# Patient Record
Sex: Female | Born: 1980 | Race: White | Hispanic: No | Marital: Married | Smoking: Current every day smoker
Health system: Southern US, Community
[De-identification: ages and names within clinical notes are randomized; demographics above are authoritative.]

## PROBLEM LIST (undated history)

## (undated) DIAGNOSIS — F1910 Other psychoactive substance abuse, uncomplicated: Secondary | ICD-10-CM

## (undated) DIAGNOSIS — G2581 Restless legs syndrome: Secondary | ICD-10-CM

## (undated) DIAGNOSIS — F32A Depression, unspecified: Secondary | ICD-10-CM

## (undated) DIAGNOSIS — F419 Anxiety disorder, unspecified: Secondary | ICD-10-CM

## (undated) DIAGNOSIS — K219 Gastro-esophageal reflux disease without esophagitis: Secondary | ICD-10-CM

## (undated) DIAGNOSIS — F329 Major depressive disorder, single episode, unspecified: Secondary | ICD-10-CM

## (undated) HISTORY — DX: Other psychoactive substance abuse, uncomplicated: F19.10

## (undated) HISTORY — DX: Major depressive disorder, single episode, unspecified: F32.9

## (undated) HISTORY — DX: Anxiety disorder, unspecified: F41.9

## (undated) HISTORY — DX: Restless legs syndrome: G25.81

## (undated) HISTORY — DX: Depression, unspecified: F32.A

## (undated) HISTORY — DX: Gastro-esophageal reflux disease without esophagitis: K21.9

---
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
## 1999-02-04 ENCOUNTER — Encounter: Admission: RE | Admit: 1999-02-04 | Discharge: 1999-02-04 | Payer: Self-pay | Admitting: Sports Medicine

## 1999-07-02 ENCOUNTER — Encounter: Admission: RE | Admit: 1999-07-02 | Discharge: 1999-07-02 | Payer: Self-pay | Admitting: Pediatrics

## 1999-07-16 ENCOUNTER — Encounter: Admission: RE | Admit: 1999-07-16 | Discharge: 1999-07-16 | Payer: Self-pay | Admitting: Family Medicine

## 1999-07-23 ENCOUNTER — Encounter: Admission: RE | Admit: 1999-07-23 | Discharge: 1999-07-23 | Payer: Self-pay | Admitting: Family Medicine

## 2000-01-28 ENCOUNTER — Encounter: Admission: RE | Admit: 2000-01-28 | Discharge: 2000-01-28 | Payer: Self-pay | Admitting: Family Medicine

## 2000-09-13 ENCOUNTER — Other Ambulatory Visit: Admission: RE | Admit: 2000-09-13 | Discharge: 2000-10-16 | Payer: Self-pay | Admitting: Obstetrics

## 2000-09-21 ENCOUNTER — Encounter: Admission: RE | Admit: 2000-09-21 | Discharge: 2000-09-21 | Payer: Self-pay | Admitting: Family Medicine

## 2001-10-24 ENCOUNTER — Encounter (INDEPENDENT_AMBULATORY_CARE_PROVIDER_SITE_OTHER): Payer: Self-pay | Admitting: *Deleted

## 2001-10-25 ENCOUNTER — Other Ambulatory Visit: Admission: RE | Admit: 2001-10-25 | Discharge: 2001-10-25 | Payer: Self-pay | Admitting: Family Medicine

## 2001-10-25 ENCOUNTER — Encounter: Admission: RE | Admit: 2001-10-25 | Discharge: 2001-10-25 | Payer: Self-pay | Admitting: Sports Medicine

## 2001-10-25 ENCOUNTER — Encounter (INDEPENDENT_AMBULATORY_CARE_PROVIDER_SITE_OTHER): Payer: Self-pay | Admitting: Specialist

## 2003-07-19 ENCOUNTER — Emergency Department (HOSPITAL_COMMUNITY): Admission: EM | Admit: 2003-07-19 | Discharge: 2003-07-19 | Payer: Self-pay | Admitting: Family Medicine

## 2003-08-13 ENCOUNTER — Encounter: Admission: RE | Admit: 2003-08-13 | Discharge: 2003-08-13 | Payer: Self-pay | Admitting: Family Medicine

## 2003-09-10 ENCOUNTER — Encounter: Admission: RE | Admit: 2003-09-10 | Discharge: 2003-09-10 | Payer: Self-pay | Admitting: Family Medicine

## 2004-06-12 ENCOUNTER — Ambulatory Visit: Payer: Self-pay | Admitting: Sports Medicine

## 2004-07-25 ENCOUNTER — Ambulatory Visit: Payer: Self-pay | Admitting: Family Medicine

## 2005-10-30 ENCOUNTER — Emergency Department (HOSPITAL_COMMUNITY): Admission: EM | Admit: 2005-10-30 | Discharge: 2005-10-30 | Payer: Self-pay | Admitting: Emergency Medicine

## 2005-11-13 ENCOUNTER — Ambulatory Visit: Payer: Self-pay | Admitting: Family Medicine

## 2005-11-15 ENCOUNTER — Emergency Department (HOSPITAL_COMMUNITY): Admission: EM | Admit: 2005-11-15 | Discharge: 2005-11-15 | Payer: Self-pay | Admitting: Emergency Medicine

## 2005-11-16 ENCOUNTER — Ambulatory Visit: Payer: Self-pay | Admitting: Family Medicine

## 2005-11-17 ENCOUNTER — Emergency Department (HOSPITAL_COMMUNITY): Admission: EM | Admit: 2005-11-17 | Discharge: 2005-11-17 | Payer: Self-pay | Admitting: *Deleted

## 2005-11-17 ENCOUNTER — Emergency Department: Payer: Self-pay | Admitting: Unknown Physician Specialty

## 2005-11-18 ENCOUNTER — Emergency Department: Payer: Self-pay | Admitting: Emergency Medicine

## 2005-11-20 ENCOUNTER — Encounter (INDEPENDENT_AMBULATORY_CARE_PROVIDER_SITE_OTHER): Payer: Self-pay | Admitting: Specialist

## 2005-11-20 ENCOUNTER — Ambulatory Visit (HOSPITAL_COMMUNITY): Admission: RE | Admit: 2005-11-20 | Discharge: 2005-11-20 | Payer: Self-pay | Admitting: Gastroenterology

## 2006-02-02 ENCOUNTER — Ambulatory Visit: Payer: Self-pay | Admitting: Family Medicine

## 2006-04-22 DIAGNOSIS — F17200 Nicotine dependence, unspecified, uncomplicated: Secondary | ICD-10-CM

## 2006-04-23 ENCOUNTER — Encounter (INDEPENDENT_AMBULATORY_CARE_PROVIDER_SITE_OTHER): Payer: Self-pay | Admitting: *Deleted

## 2006-05-21 ENCOUNTER — Encounter: Admission: RE | Admit: 2006-05-21 | Discharge: 2006-05-21 | Payer: Self-pay | Admitting: Emergency Medicine

## 2006-06-03 ENCOUNTER — Telehealth: Payer: Self-pay | Admitting: *Deleted

## 2006-07-08 ENCOUNTER — Encounter (INDEPENDENT_AMBULATORY_CARE_PROVIDER_SITE_OTHER): Payer: Self-pay | Admitting: Family Medicine

## 2006-07-08 LAB — CONVERTED CEMR LAB: RH AB Titer: NEGATIVE

## 2007-01-14 ENCOUNTER — Telehealth: Payer: Self-pay | Admitting: *Deleted

## 2007-01-17 ENCOUNTER — Ambulatory Visit: Payer: Self-pay | Admitting: Family Medicine

## 2007-01-19 ENCOUNTER — Ambulatory Visit (HOSPITAL_COMMUNITY): Admission: RE | Admit: 2007-01-19 | Discharge: 2007-01-19 | Payer: Self-pay | Admitting: Family Medicine

## 2007-01-19 ENCOUNTER — Ambulatory Visit: Payer: Self-pay | Admitting: Family Medicine

## 2007-01-28 ENCOUNTER — Ambulatory Visit: Payer: Self-pay | Admitting: Family Medicine

## 2007-02-11 ENCOUNTER — Ambulatory Visit: Payer: Self-pay | Admitting: Family Medicine

## 2007-02-11 ENCOUNTER — Encounter: Payer: Self-pay | Admitting: Family Medicine

## 2007-02-11 ENCOUNTER — Encounter (INDEPENDENT_AMBULATORY_CARE_PROVIDER_SITE_OTHER): Payer: Self-pay | Admitting: Family Medicine

## 2007-02-11 ENCOUNTER — Other Ambulatory Visit: Admission: RE | Admit: 2007-02-11 | Discharge: 2007-02-11 | Payer: Self-pay | Admitting: Family Medicine

## 2007-02-11 DIAGNOSIS — K219 Gastro-esophageal reflux disease without esophagitis: Secondary | ICD-10-CM | POA: Insufficient documentation

## 2007-02-11 DIAGNOSIS — F410 Panic disorder [episodic paroxysmal anxiety] without agoraphobia: Secondary | ICD-10-CM

## 2007-02-11 LAB — CONVERTED CEMR LAB: GC Culture Only: NEGATIVE

## 2007-02-12 LAB — CONVERTED CEMR LAB
Basophils Absolute: 0 10*3/uL (ref 0.0–0.1)
Chlamydia, DNA Probe: NEGATIVE
Hemoglobin: 13.9 g/dL (ref 12.0–15.0)
Lymphocytes Relative: 31 % (ref 12–46)
Lymphs Abs: 2.2 10*3/uL (ref 0.7–4.0)
Monocytes Absolute: 0.6 10*3/uL (ref 0.1–1.0)
Monocytes Relative: 8 % (ref 3–12)
Neutro Abs: 4.1 10*3/uL (ref 1.7–7.7)
RBC: 4.85 M/uL (ref 3.87–5.11)
RDW: 13.5 % (ref 11.5–15.5)
Rh Type: NEGATIVE
Rubella: 17.6 intl units/mL — ABNORMAL HIGH
WBC: 7 10*3/uL (ref 4.0–10.5)

## 2007-02-14 ENCOUNTER — Telehealth: Payer: Self-pay | Admitting: *Deleted

## 2007-03-04 ENCOUNTER — Telehealth (INDEPENDENT_AMBULATORY_CARE_PROVIDER_SITE_OTHER): Payer: Self-pay | Admitting: *Deleted

## 2007-03-16 ENCOUNTER — Ambulatory Visit: Payer: Self-pay | Admitting: Family Medicine

## 2007-03-17 ENCOUNTER — Telehealth: Payer: Self-pay | Admitting: *Deleted

## 2007-04-14 ENCOUNTER — Ambulatory Visit: Payer: Self-pay | Admitting: Family Medicine

## 2007-04-14 LAB — CONVERTED CEMR LAB
Glucose, Urine, Semiquant: NEGATIVE
Protein, U semiquant: NEGATIVE

## 2007-05-05 ENCOUNTER — Ambulatory Visit (HOSPITAL_COMMUNITY): Admission: RE | Admit: 2007-05-05 | Discharge: 2007-05-05 | Payer: Self-pay | Admitting: Family Medicine

## 2007-05-05 ENCOUNTER — Encounter (INDEPENDENT_AMBULATORY_CARE_PROVIDER_SITE_OTHER): Payer: Self-pay | Admitting: Family Medicine

## 2007-05-10 ENCOUNTER — Telehealth (INDEPENDENT_AMBULATORY_CARE_PROVIDER_SITE_OTHER): Payer: Self-pay | Admitting: Family Medicine

## 2007-05-24 ENCOUNTER — Ambulatory Visit: Payer: Self-pay | Admitting: Family Medicine

## 2007-06-27 ENCOUNTER — Ambulatory Visit: Payer: Self-pay | Admitting: Family Medicine

## 2007-06-27 LAB — CONVERTED CEMR LAB: Protein, U semiquant: NEGATIVE

## 2007-07-06 ENCOUNTER — Ambulatory Visit (HOSPITAL_COMMUNITY): Admission: RE | Admit: 2007-07-06 | Discharge: 2007-07-06 | Payer: Self-pay | Admitting: Family Medicine

## 2007-07-07 ENCOUNTER — Encounter (INDEPENDENT_AMBULATORY_CARE_PROVIDER_SITE_OTHER): Payer: Self-pay | Admitting: Family Medicine

## 2007-07-07 ENCOUNTER — Ambulatory Visit: Payer: Self-pay | Admitting: Family Medicine

## 2007-07-11 ENCOUNTER — Ambulatory Visit: Payer: Self-pay | Admitting: Sports Medicine

## 2007-07-11 LAB — CONVERTED CEMR LAB
Glucose, Urine, Semiquant: NEGATIVE
Protein, U semiquant: NEGATIVE

## 2007-07-13 ENCOUNTER — Ambulatory Visit: Payer: Self-pay | Admitting: Family Medicine

## 2007-07-13 ENCOUNTER — Encounter (INDEPENDENT_AMBULATORY_CARE_PROVIDER_SITE_OTHER): Payer: Self-pay | Admitting: Family Medicine

## 2007-07-14 LAB — CONVERTED CEMR LAB

## 2007-07-28 ENCOUNTER — Ambulatory Visit: Payer: Self-pay | Admitting: Family Medicine

## 2007-08-01 ENCOUNTER — Encounter (INDEPENDENT_AMBULATORY_CARE_PROVIDER_SITE_OTHER): Payer: Self-pay | Admitting: Family Medicine

## 2007-08-01 ENCOUNTER — Ambulatory Visit (HOSPITAL_COMMUNITY): Admission: RE | Admit: 2007-08-01 | Discharge: 2007-08-01 | Payer: Self-pay | Admitting: Family Medicine

## 2007-08-09 ENCOUNTER — Ambulatory Visit: Payer: Self-pay | Admitting: Family Medicine

## 2007-08-09 LAB — CONVERTED CEMR LAB: Glucose, Urine, Semiquant: NEGATIVE

## 2007-08-24 ENCOUNTER — Ambulatory Visit (HOSPITAL_COMMUNITY): Admission: RE | Admit: 2007-08-24 | Discharge: 2007-08-24 | Payer: Self-pay | Admitting: Family Medicine

## 2007-08-24 ENCOUNTER — Ambulatory Visit: Payer: Self-pay | Admitting: Family Medicine

## 2007-08-24 ENCOUNTER — Encounter (INDEPENDENT_AMBULATORY_CARE_PROVIDER_SITE_OTHER): Payer: Self-pay | Admitting: Family Medicine

## 2007-08-24 ENCOUNTER — Telehealth: Payer: Self-pay | Admitting: *Deleted

## 2007-08-24 LAB — CONVERTED CEMR LAB: Protein, U semiquant: NEGATIVE

## 2007-08-25 ENCOUNTER — Encounter (INDEPENDENT_AMBULATORY_CARE_PROVIDER_SITE_OTHER): Payer: Self-pay | Admitting: Family Medicine

## 2007-09-01 ENCOUNTER — Ambulatory Visit: Payer: Self-pay | Admitting: Family Medicine

## 2007-09-01 ENCOUNTER — Telehealth: Payer: Self-pay | Admitting: *Deleted

## 2007-09-07 ENCOUNTER — Ambulatory Visit: Payer: Self-pay | Admitting: Family Medicine

## 2007-09-07 LAB — CONVERTED CEMR LAB
Glucose, Urine, Semiquant: NEGATIVE
Protein, U semiquant: NEGATIVE

## 2007-09-14 ENCOUNTER — Ambulatory Visit: Payer: Self-pay | Admitting: Family Medicine

## 2007-09-14 ENCOUNTER — Encounter (INDEPENDENT_AMBULATORY_CARE_PROVIDER_SITE_OTHER): Payer: Self-pay | Admitting: Family Medicine

## 2007-09-14 ENCOUNTER — Ambulatory Visit (HOSPITAL_COMMUNITY): Admission: RE | Admit: 2007-09-14 | Discharge: 2007-09-14 | Payer: Self-pay | Admitting: Family Medicine

## 2007-09-19 ENCOUNTER — Ambulatory Visit: Payer: Self-pay | Admitting: Obstetrics and Gynecology

## 2007-09-19 ENCOUNTER — Inpatient Hospital Stay (HOSPITAL_COMMUNITY): Admission: AD | Admit: 2007-09-19 | Discharge: 2007-09-21 | Payer: Self-pay | Admitting: Family Medicine

## 2007-09-19 ENCOUNTER — Encounter: Payer: Self-pay | Admitting: Obstetrics & Gynecology

## 2007-09-19 ENCOUNTER — Telehealth (INDEPENDENT_AMBULATORY_CARE_PROVIDER_SITE_OTHER): Payer: Self-pay | Admitting: Family Medicine

## 2007-09-23 ENCOUNTER — Telehealth: Payer: Self-pay | Admitting: *Deleted

## 2007-09-26 ENCOUNTER — Telehealth: Payer: Self-pay | Admitting: *Deleted

## 2007-09-26 ENCOUNTER — Ambulatory Visit: Payer: Self-pay | Admitting: Sports Medicine

## 2007-10-04 ENCOUNTER — Telehealth (INDEPENDENT_AMBULATORY_CARE_PROVIDER_SITE_OTHER): Payer: Self-pay | Admitting: Family Medicine

## 2007-10-06 ENCOUNTER — Encounter (INDEPENDENT_AMBULATORY_CARE_PROVIDER_SITE_OTHER): Payer: Self-pay | Admitting: Family Medicine

## 2007-10-06 ENCOUNTER — Ambulatory Visit: Payer: Self-pay | Admitting: Family Medicine

## 2007-10-06 LAB — CONVERTED CEMR LAB
Protein, U semiquant: NEGATIVE
Urobilinogen, UA: 0.2
WBC Urine, dipstick: NEGATIVE

## 2007-10-07 ENCOUNTER — Encounter (INDEPENDENT_AMBULATORY_CARE_PROVIDER_SITE_OTHER): Payer: Self-pay | Admitting: Family Medicine

## 2007-10-07 LAB — CONVERTED CEMR LAB
AST: 15 units/L (ref 0–37)
Albumin: 4.1 g/dL (ref 3.5–5.2)
BUN: 12 mg/dL (ref 6–23)
Barbiturate Quant, Ur: NEGATIVE
Benzodiazepines.: NEGATIVE
Calcium: 9.2 mg/dL (ref 8.4–10.5)
Chloride: 107 meq/L (ref 96–112)
Glucose, Bld: 91 mg/dL (ref 70–99)
Hemoglobin: 14.2 g/dL (ref 12.0–15.0)
Marijuana Metabolite: POSITIVE — AB
Methadone: NEGATIVE
Potassium: 3.9 meq/L (ref 3.5–5.3)
Propoxyphene: NEGATIVE
RBC: 4.58 M/uL (ref 3.87–5.11)
RDW: 13.2 % (ref 11.5–15.5)
TSH: 1.108 microintl units/mL (ref 0.350–4.50)
Total Protein: 6.9 g/dL (ref 6.0–8.3)

## 2007-10-14 ENCOUNTER — Ambulatory Visit: Payer: Self-pay | Admitting: Family Medicine

## 2007-11-07 ENCOUNTER — Encounter: Payer: Self-pay | Admitting: *Deleted

## 2007-11-23 ENCOUNTER — Ambulatory Visit: Payer: Self-pay | Admitting: Family Medicine

## 2007-11-24 ENCOUNTER — Telehealth: Payer: Self-pay | Admitting: *Deleted

## 2007-11-24 ENCOUNTER — Ambulatory Visit: Payer: Self-pay | Admitting: Family Medicine

## 2007-11-25 ENCOUNTER — Telehealth: Payer: Self-pay | Admitting: *Deleted

## 2007-12-26 ENCOUNTER — Telehealth: Payer: Self-pay | Admitting: *Deleted

## 2007-12-28 ENCOUNTER — Telehealth (INDEPENDENT_AMBULATORY_CARE_PROVIDER_SITE_OTHER): Payer: Self-pay | Admitting: *Deleted

## 2008-01-02 ENCOUNTER — Ambulatory Visit: Payer: Self-pay | Admitting: Family Medicine

## 2008-01-24 ENCOUNTER — Telehealth: Payer: Self-pay | Admitting: *Deleted

## 2008-02-27 ENCOUNTER — Telehealth (INDEPENDENT_AMBULATORY_CARE_PROVIDER_SITE_OTHER): Payer: Self-pay | Admitting: Family Medicine

## 2008-03-19 ENCOUNTER — Other Ambulatory Visit: Admission: RE | Admit: 2008-03-19 | Discharge: 2008-03-19 | Payer: Self-pay | Admitting: Family Medicine

## 2008-03-19 ENCOUNTER — Encounter (INDEPENDENT_AMBULATORY_CARE_PROVIDER_SITE_OTHER): Payer: Self-pay | Admitting: Family Medicine

## 2008-03-19 ENCOUNTER — Ambulatory Visit: Payer: Self-pay | Admitting: Family Medicine

## 2008-03-19 DIAGNOSIS — G5600 Carpal tunnel syndrome, unspecified upper limb: Secondary | ICD-10-CM | POA: Insufficient documentation

## 2008-03-19 LAB — CONVERTED CEMR LAB
Chlamydia, DNA Probe: NEGATIVE
Cholesterol: 127 mg/dL (ref 0–200)
GC Probe Amp, Genital: NEGATIVE
HDL: 44 mg/dL (ref >39)
LDL Cholesterol: 74 mg/dL (ref 0–99)
Triglycerides: 45 mg/dL (ref <150)

## 2008-03-27 ENCOUNTER — Telehealth: Payer: Self-pay | Admitting: *Deleted

## 2008-07-24 ENCOUNTER — Encounter (INDEPENDENT_AMBULATORY_CARE_PROVIDER_SITE_OTHER): Payer: Self-pay | Admitting: Family Medicine

## 2008-11-28 ENCOUNTER — Telehealth: Payer: Self-pay | Admitting: Family Medicine

## 2008-12-20 ENCOUNTER — Ambulatory Visit: Payer: Self-pay | Admitting: Family Medicine

## 2008-12-20 DIAGNOSIS — F418 Other specified anxiety disorders: Secondary | ICD-10-CM

## 2008-12-20 DIAGNOSIS — L708 Other acne: Secondary | ICD-10-CM

## 2009-01-24 ENCOUNTER — Telehealth: Payer: Self-pay | Admitting: Family Medicine

## 2009-03-25 ENCOUNTER — Telehealth: Payer: Self-pay | Admitting: Family Medicine

## 2009-04-30 ENCOUNTER — Telehealth: Payer: Self-pay | Admitting: Family Medicine

## 2009-05-03 ENCOUNTER — Ambulatory Visit: Payer: Self-pay | Admitting: Family Medicine

## 2009-05-03 ENCOUNTER — Other Ambulatory Visit: Admission: RE | Admit: 2009-05-03 | Discharge: 2009-05-03 | Payer: Self-pay | Admitting: Family Medicine

## 2009-05-03 DIAGNOSIS — N912 Amenorrhea, unspecified: Secondary | ICD-10-CM | POA: Insufficient documentation

## 2009-05-07 ENCOUNTER — Encounter: Payer: Self-pay | Admitting: Family Medicine

## 2009-05-16 IMAGING — US US OB FOLLOW-UP
1 series · 14 of 26 positions shown · non-contrast
Comparison: none

OBSTETRICAL ULTRASOUND:
 This ultrasound exam was performed in the [HOSPITAL] Ultrasound Department.  The OB US report was generated in the AS system, and faxed to the ordering physician.  This report is also available in [REDACTED] PACS.

[Series 1: us ob follow-up · 0.28mm/px · 14 of 26 slices shown]
[im 1/26]
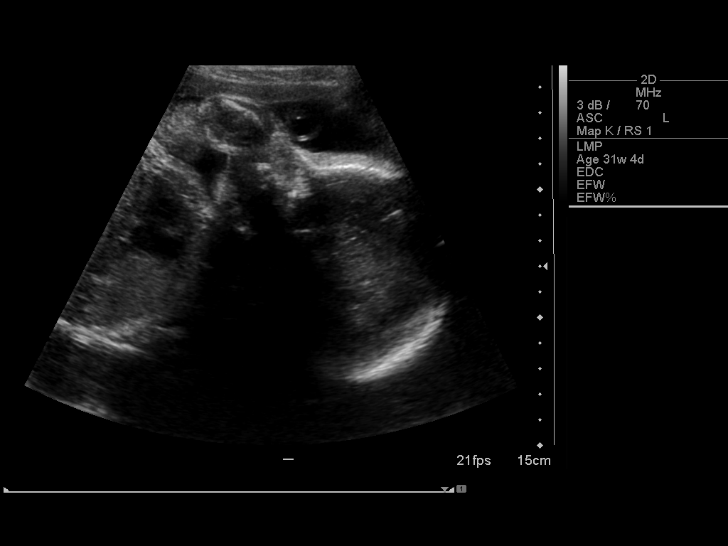
[im 3/26]
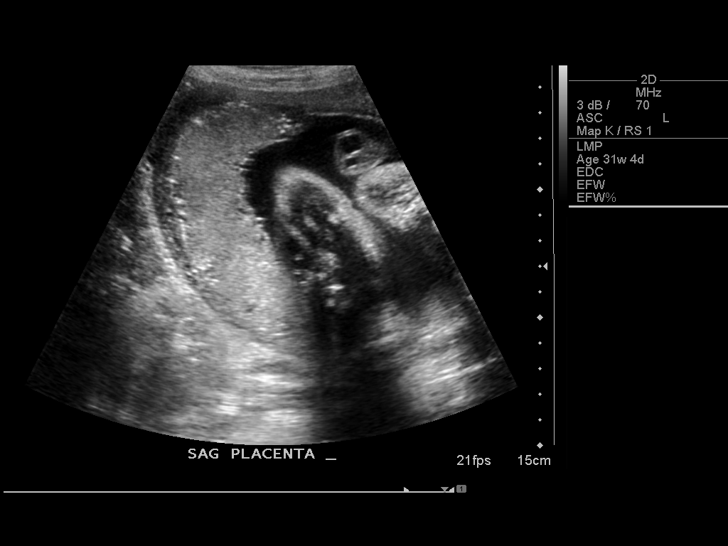
[im 5/26]
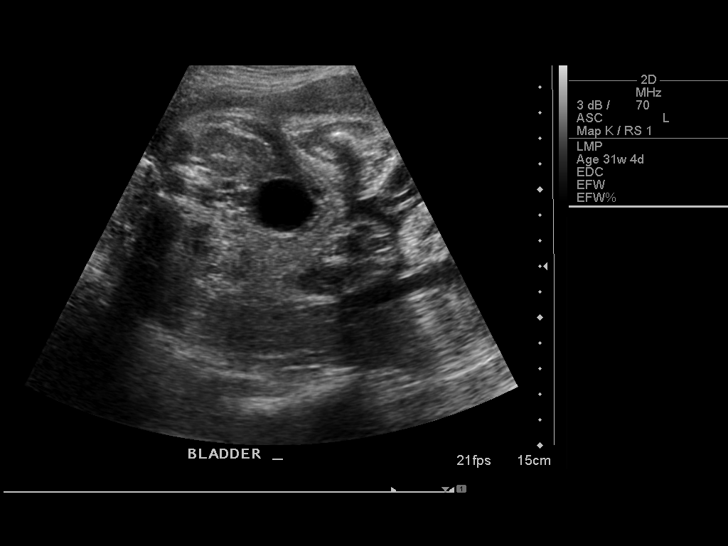
[im 7/26]
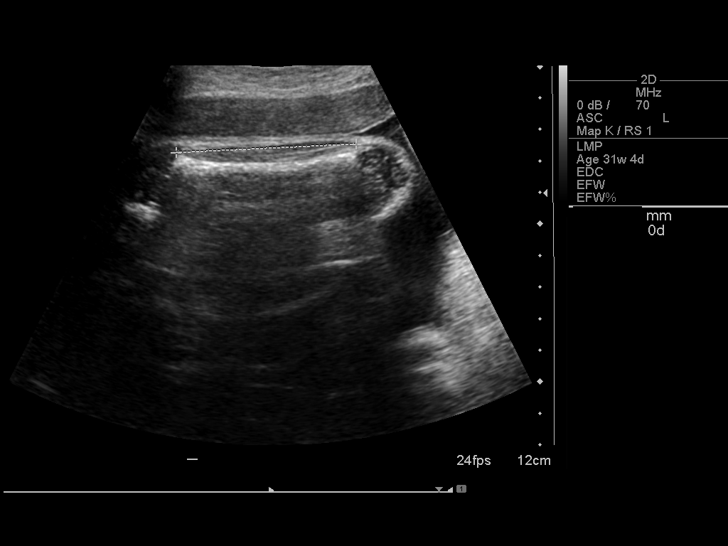
[im 9/26]
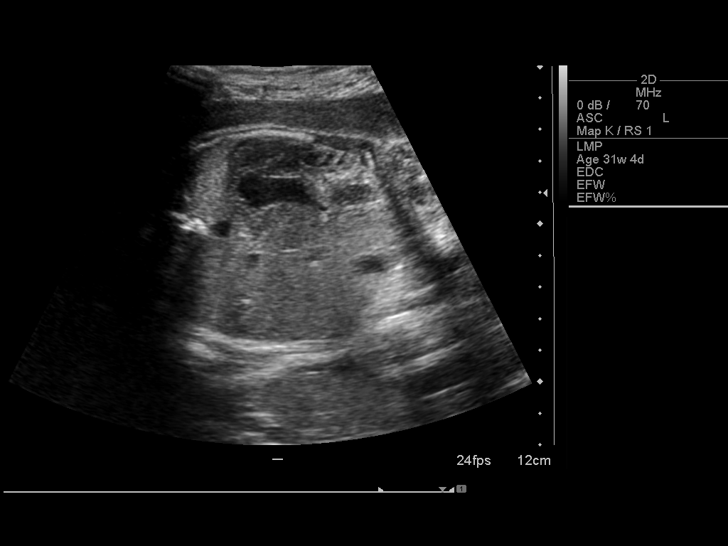
[im 11/26]
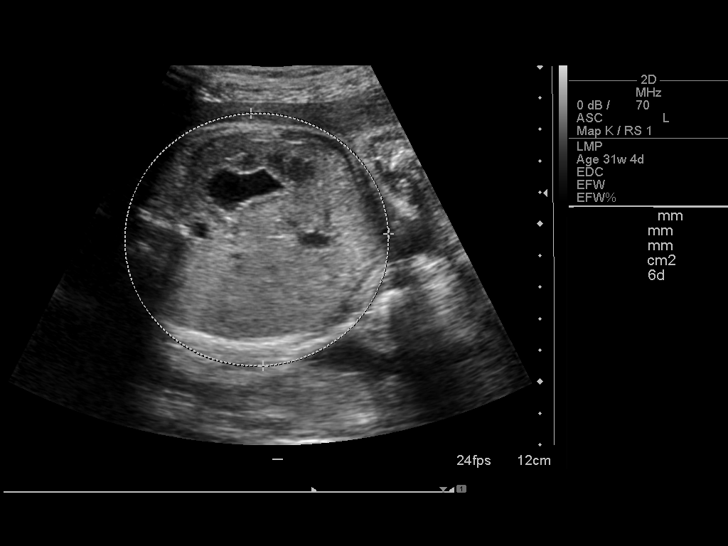
[im 13/26]
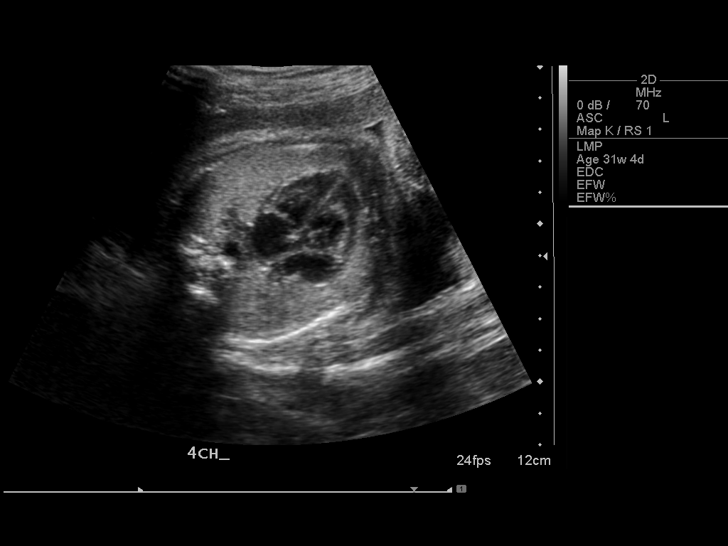
[im 14/26]
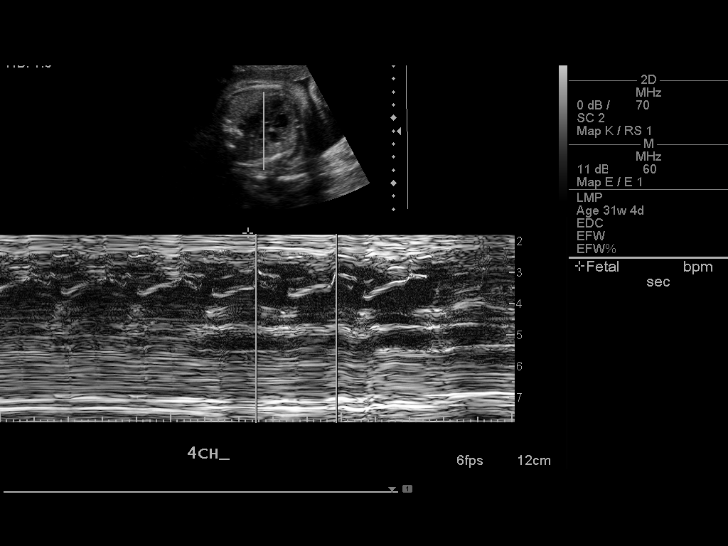
[im 16/26]
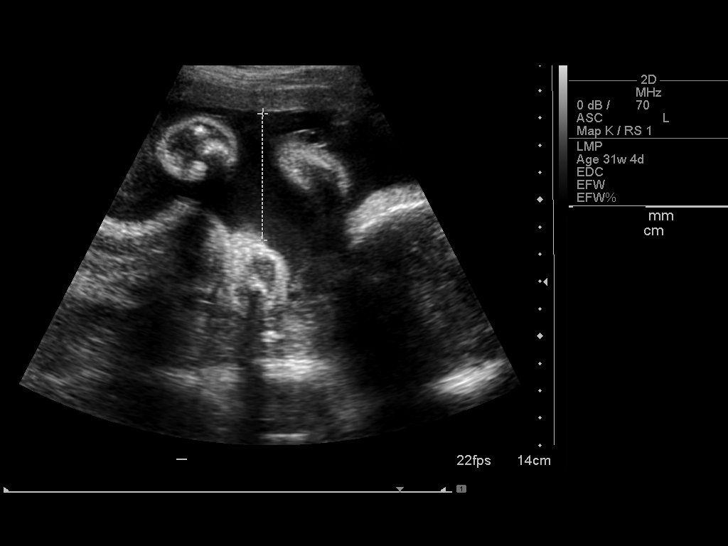
[im 18/26]
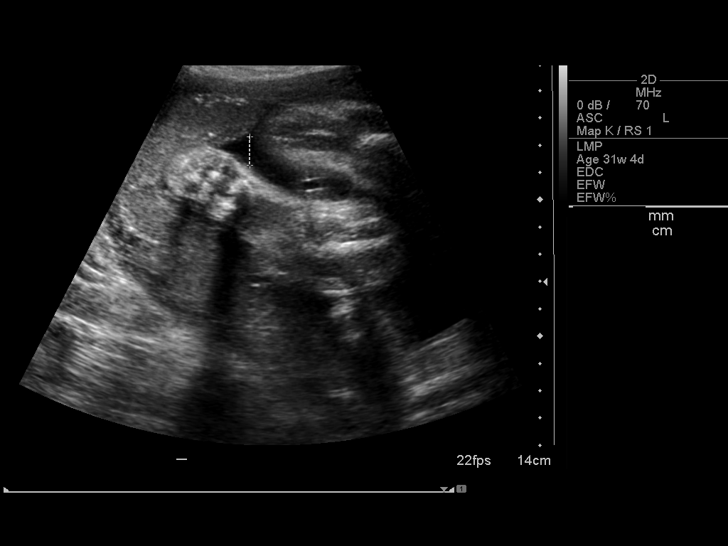
[im 20/26]
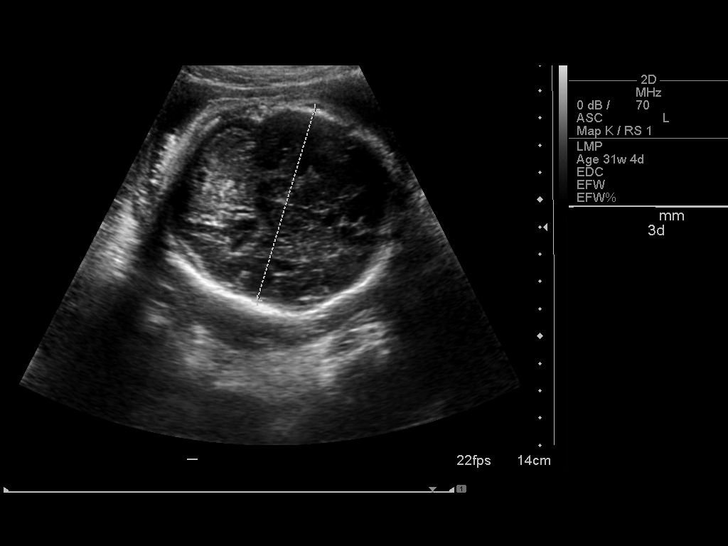
[im 22/26]
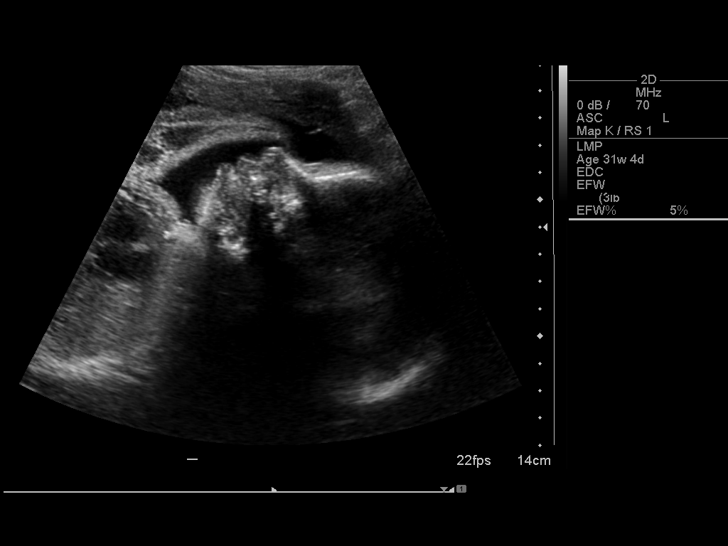
[im 24/26]
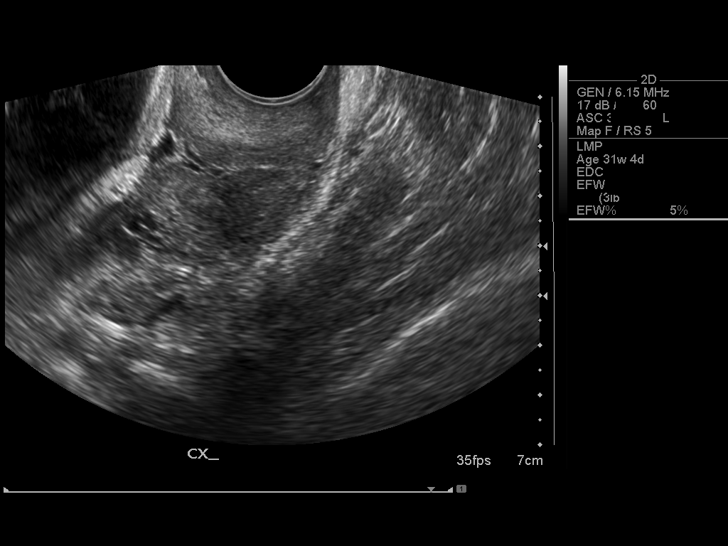
[im 26/26]
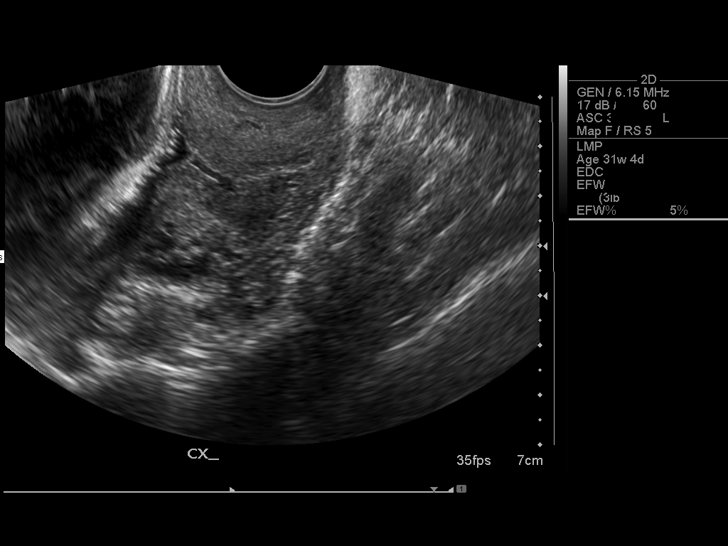

[14 of 26 positions shown; findings below may reference images not displayed]

IMPRESSION: See AS Obstetric US report.

## 2009-05-24 ENCOUNTER — Telehealth: Payer: Self-pay | Admitting: Family Medicine

## 2009-05-30 ENCOUNTER — Telehealth: Payer: Self-pay | Admitting: Family Medicine

## 2009-06-08 IMAGING — US US OB FOLLOW-UP
1 series · 14 of 28 positions shown · non-contrast
Comparison: none

OBSTETRICAL ULTRASOUND:
 This ultrasound exam was performed in the [HOSPITAL] Ultrasound Department.  The OB US report was generated in the AS system, and faxed to the ordering physician.  This report is also available in [REDACTED] PACS.

[Series 1: us ob follow-up · 0.28mm/px · 14 of 29 slices shown]
[im 2/29]
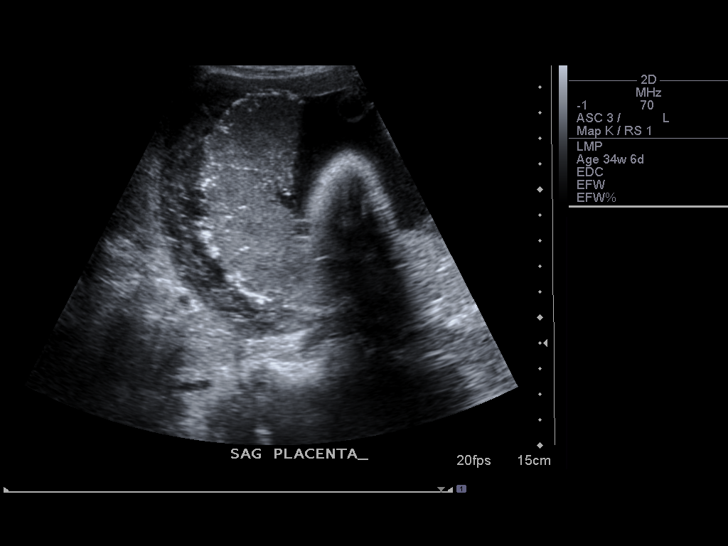
[im 4/29]
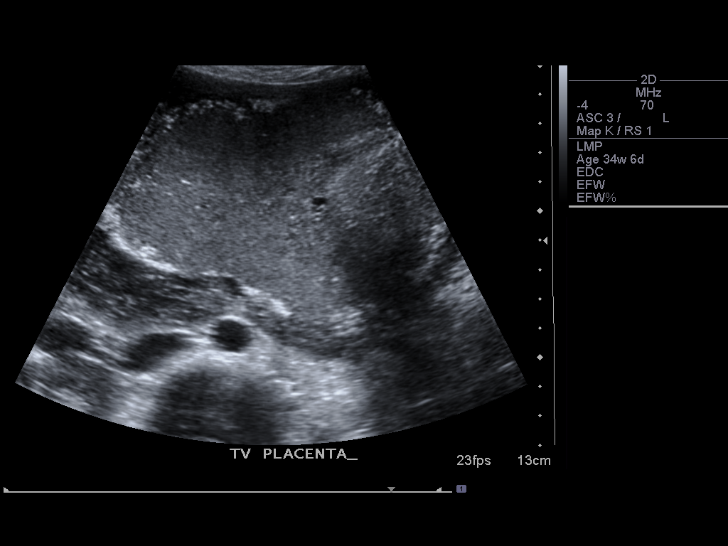
[im 6/29]
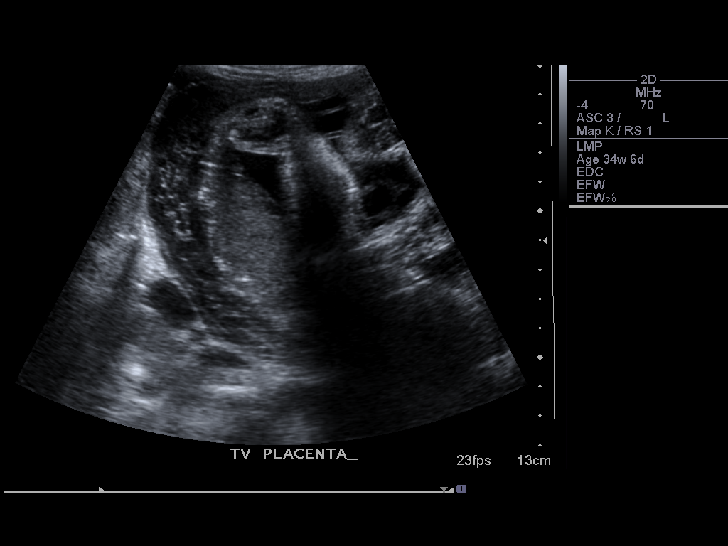
[im 8/29]
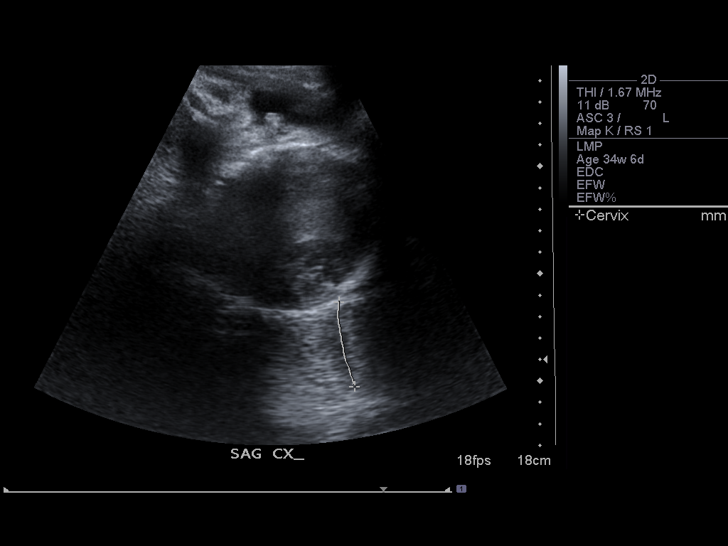
[im 10/29]
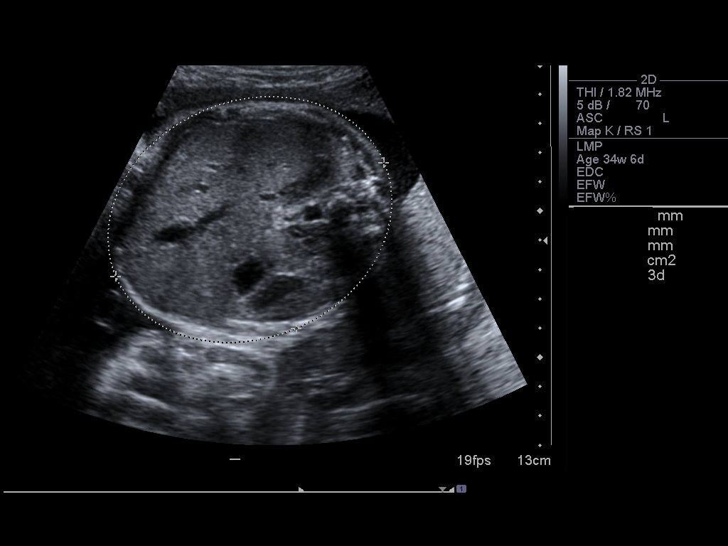
[im 12/29]
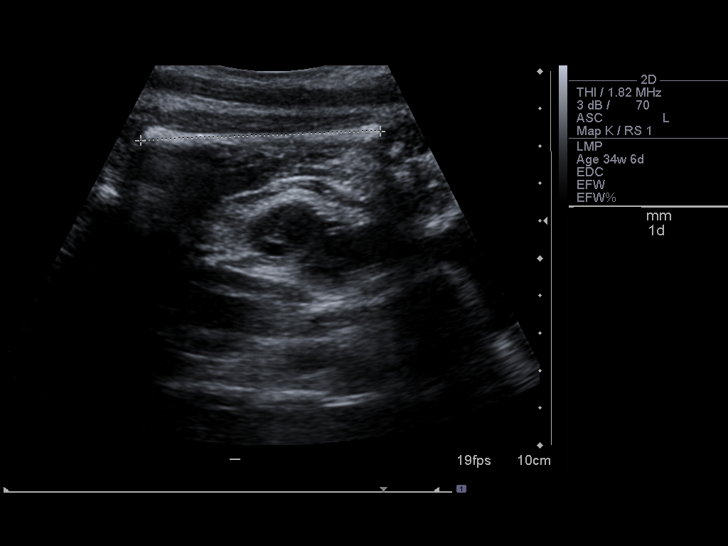
[im 14/29]
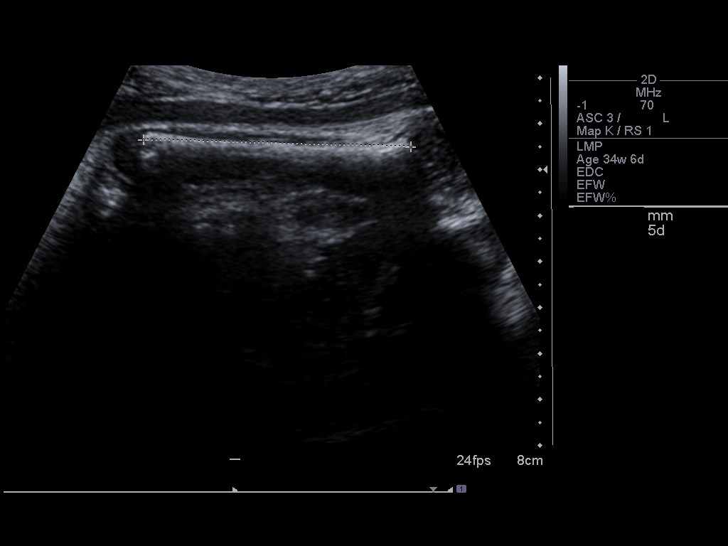
[im 16/29]
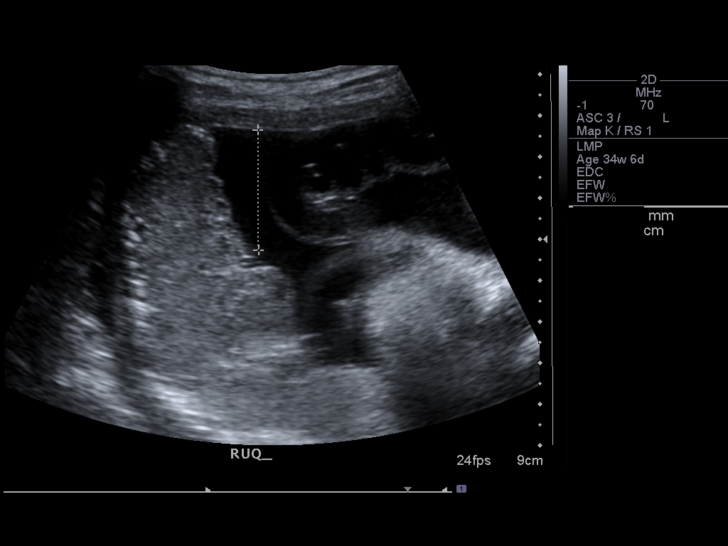
[im 18/29]
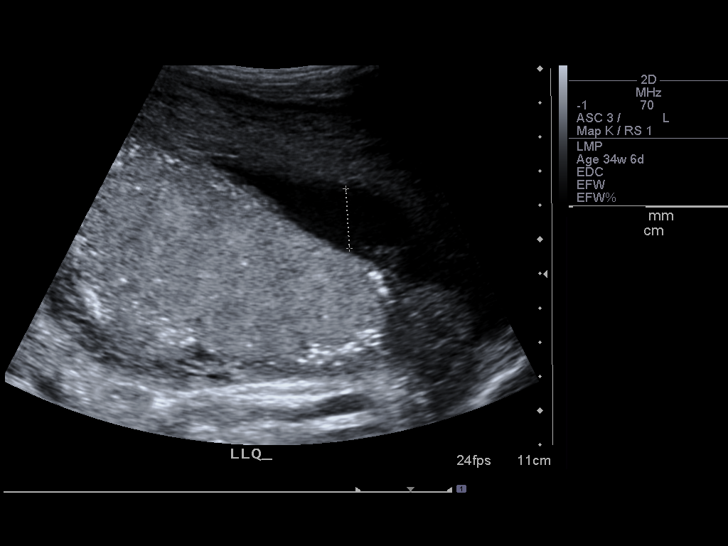
[im 20/29]
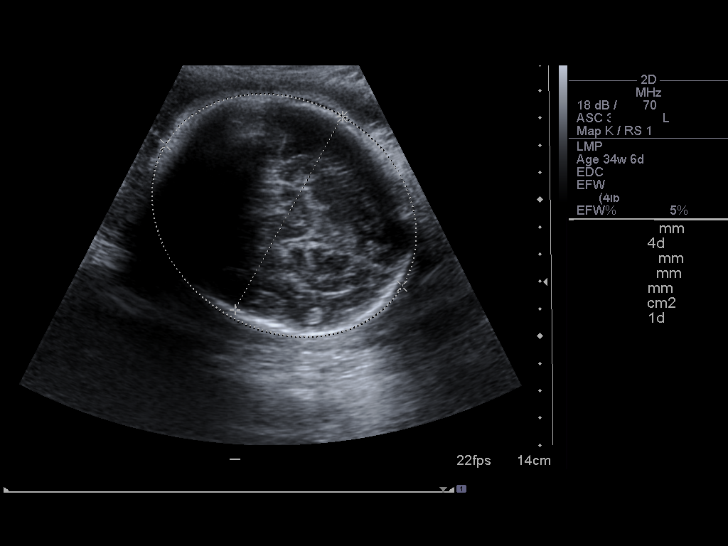
[im 22/29]
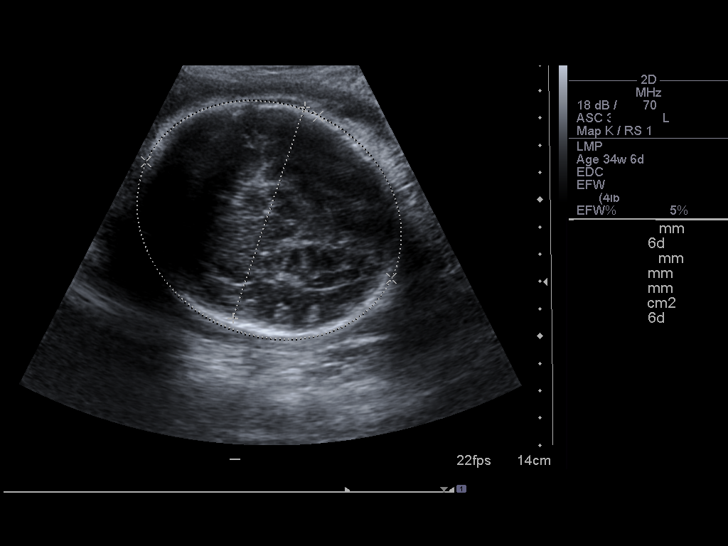
[im 24/29]
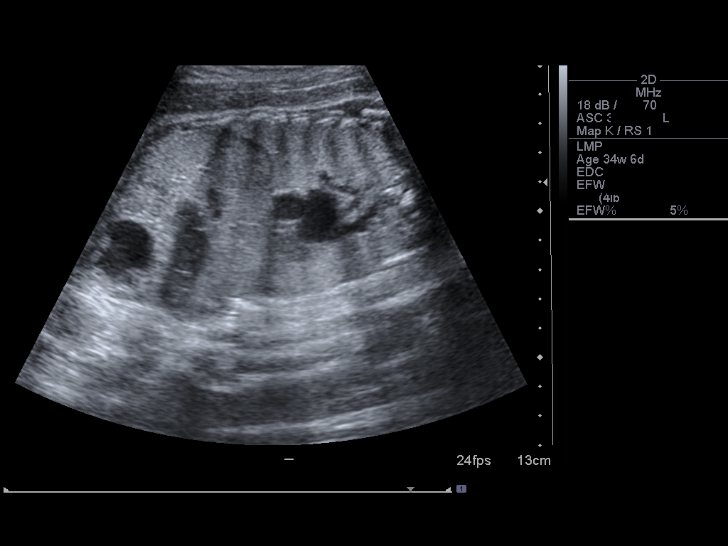
[im 26/29]
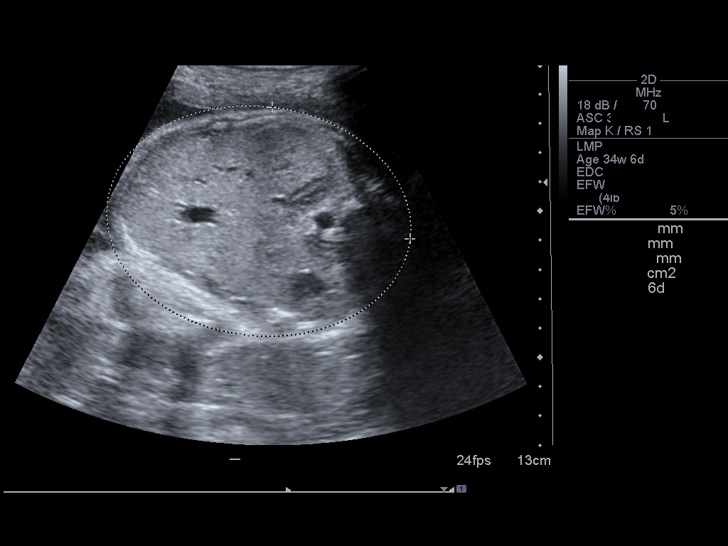
[im 29/29]
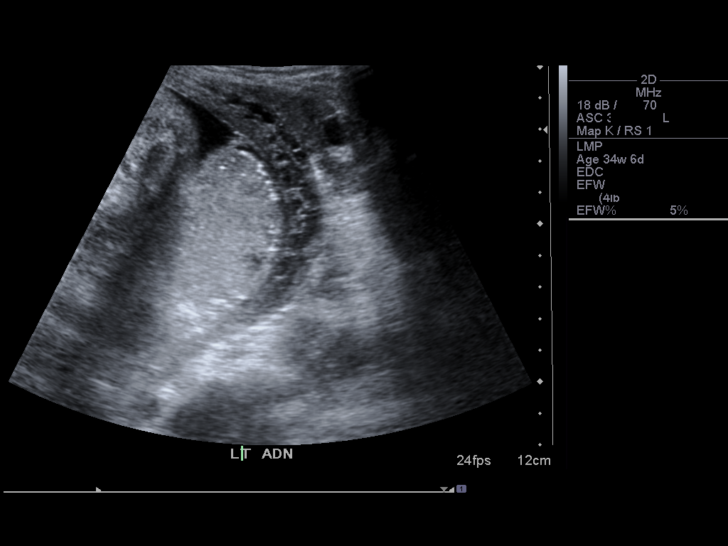

[14 of 28 positions shown; findings below may reference images not displayed]

IMPRESSION: See AS Obstetric US report.

## 2009-09-26 ENCOUNTER — Telehealth: Payer: Self-pay | Admitting: Family Medicine

## 2009-12-26 ENCOUNTER — Ambulatory Visit: Payer: Self-pay | Admitting: Family Medicine

## 2010-03-25 NOTE — Progress Notes (Signed)
Summary: refill  Phone Note Refill Request Call back at Home Phone [phone] Message from:  Patient  Refills Requested: Medication #1:  ALPRAZOLAM 1 MG  TABS one by mouth three times a day as needed anxiety Initial call taken by: De Nurse,  September 26, 2009 3:49 PM  Follow-up for Phone Call        faxed rx to [facility] Follow-up by: De Nurse,  September 30, 2009 4:06 PM    Prescriptions: ALPRAZOLAM 1 MG  TABS (ALPRAZOLAM) one by mouth three times a day as needed anxiety  #90 x 0   Entered and Authorized by:   Angelena Sole MD   Signed by:   Angelena Sole MD on 09/26/2009   Method used:   Print then Give to Patient   RxID:   [id number]  Rx ready up front

## 2010-03-25 NOTE — Progress Notes (Signed)
Summary: Rx Req  Phone Note Refill Request Call back at Home Phone [phone] Message from:  Patient  Refills Requested: Medication #1:  ALPRAZOLAM 1 MG  TABS one by mouth three times a day as needed anxiety Initial call taken by: Clydell Hakim,  May 24, 2009 10:21 AM  Follow-up for Phone Call        She should have three refills Follow-up by: Angelena Sole MD,  May 24, 2009 12:10 PM  Additional Follow-up for Phone Call Additional follow up Details #1::        Pt says that that was 3 months ago.   Additional Follow-up by: Clydell Hakim,  May 27, 2009 9:47 AM    Prescriptions: ALPRAZOLAM 1 MG  TABS (ALPRAZOLAM) one by mouth three times a day as needed anxiety  #90 x 3   Entered and Authorized by:   Angelena Sole MD   Signed by:   Angelena Sole MD on 05/27/2009   Method used:   Telephoned to ...       [facility] (retail)       [address]       [place], [place]  [address]       Ph: [phone]       Fax: [phone]   RxID:   [id number]   Appended Document: Rx Req LVM that rx sent in.

## 2010-03-25 NOTE — Progress Notes (Signed)
Summary: triage  Phone Note Call from Patient Call back at Home Phone [phone]   Caller: Patient Summary of Call: Pt trying to quit smoking and wondering what she can do for this? Initial call taken by: Clydell Hakim,  May 30, 2009 11:00 AM  Follow-up for Phone Call        both she & her husband are trying to quit. appt made for wi tomorrow at 8:30 at her request. told her there are different meds & we have free classes. she wants her husband to be a pt here too. told her to speak to front desk staff about that tomorrow Follow-up by: Golden Circle RN,  May 30, 2009 11:00 AM

## 2010-03-25 NOTE — Assessment & Plan Note (Signed)
Summary: CPE/KH   Vital Signs:  Patient profile:   30 year old female Height:      62.5 inches Weight:      101.7 pounds BMI:     18.37 Temp:     97.7 degrees F Pulse rate:   88 / minute BP sitting:   100 / 63 CC: cpp Is Patient Diabetic? No Pain Assessment Patient in pain? no        Primary Care Provider:  Angelena Sole Mccullough  CC:  cpp.  History of Present Illness: Preventive Care 1. PAP smear: Has had 2 normal pap smears 2. Smoking: she continues to smoke, is interested in quiting.  Thinks that she would need to quit at the same time as her husband  Health Concerns: 1. Ammenorrhea: Has not had a period for the past 2 months.  She has also gained some weight.  She has a Mirena in which she was having some spotting up until this past December 2. Esophagitis / Hiatal Hernia: diagnosed with this in 2007 by Dr. Elnoria Howard.  Put on Prilosec was concerned about whether she would need follow up or not.  Habits & Providers  Alcohol-Tobacco-Diet     Tobacco Status: current     Cigarette Packs/Day: 0.5  Current Medications (verified): 1)  Lexapro 10 Mg Tabs (Escitalopram Oxalate) .... One By Mouth Daily 2)  Alprazolam 1 Mg  Tabs (Alprazolam) .... One By Mouth Three Times A Day As Needed Anxiety 3)  Prilosec 40 Mg Cpdr (Omeprazole) .... One By Mouth Daily 4)  Bilateral Cock-Up Wrist Splints .... Wear Nightly 5)  Doxycycline Hyclate 100 Mg Tabs (Doxycycline Hyclate) .... Take 1/2 Tab By Mouth Twice A Day For 8 Weeks 6)  Benzoyl Peroxide 2.5 % Gel (Benzoyl Peroxide) .... Apply Small Amount To Acne Twice A Day  Allergies: No Known Drug Allergies  Past History:  Past Medical History: Reviewed history from 07/24/2008 and no changes required. 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 Have tried on klonopin / ativan with no significant relief. Currently on Xanax which she never asks for early. Controlled with xanax and  lexapro. Reflux Mirena IUD inserted 11/24/07  Social History: Reviewed history from 12/20/2008 and no changes required. 3 children. smokes 1/2 - 1 pack qd.  Husband smokes.  Packs/Day:  0.5  Physical Exam  General:  Thin, no acute distress.  Alert and oriented x 3.  Head:  Normocephalic, atraumatic, no abnormalities noted.  Eyes:  perrl, eomi Mouth:  pharynx pink and moist, poor dentition  Neck:  no thyromegaly Lungs:  normal respiratory effort, no crackles, and no wheezes.   Heart:  normal rate and regular rhythm, no murmurs.   Abdomen:  S / NT / ND, +BS Genitalia:  normal introitus, small cyst on cervix, no adnexal masses or tenderness.   Msk:  thin Extremities:  no lower extremity edema Skin:  inflammatory acne across face, slightly improved Psych:  normally interactive, not anxious appearing, and not depressed appearing.     Impression & Recommendations:  Problem # 1:  Preventive Health Care (ICD-V70.0) Assessment Comment Only  Problem # 2:  SCREENING FOR MALIGNANT NEOPLASM OF THE CERVIX (ICD-V76.2) Assessment: Unchanged  Orders: Pap Smear-[facility] ([address])  Problem # 3:  ABSENCE OF MENSTRUATION (ICD-626.0) Assessment: New  Orders: U Preg-[facility] ([address]) [facility] - Est  18-39 yrs ([address])  Problem # 4:  ESOPHAGEAL REFLUX (ICD-530.81)  Refer back to Dr. Elnoria Howard in  2012 for 5 year follow up Her updated medication list for this problem includes:    Prilosec 40 Mg Cpdr (Omeprazole) ..... One by mouth daily  Orders: [facility] - Est  18-39 yrs ([address])  Complete Medication List: 1)  Lexapro 10 Mg Tabs (Escitalopram oxalate) .... One by mouth daily 2)  Alprazolam 1 Mg Tabs (Alprazolam) .... One by mouth three times a day as needed anxiety 3)  Prilosec 40 Mg Cpdr (Omeprazole) .... One by mouth daily 4)  Bilateral Cock-up Wrist Splints  .... Wear nightly 5)  Doxycycline Hyclate 100 Mg Tabs (Doxycycline hyclate) .... Take 1/2 tab by mouth twice a day for 8 weeks 6)  Benzoyl Peroxide  2.5 % Gel (Benzoyl peroxide) .... Apply small amount to acne twice a day  Patient Instructions: 1)  You are doing well 2)  We will let you know of the PAP and Pregnancy test results 3)  Please schedule a follow up appointment in 1 year for your annual physical  Prevention & Chronic Care Immunizations   Influenza vaccine: Fluvax 3+  (11/24/2007)    Tetanus booster: 11/24/1998: Done.    Pneumococcal vaccine: Not documented  Other Screening   Pap smear: NEGATIVE FOR INTRAEPITHELIAL LESIONS OR MALIGNANCY.  (03/19/2008)   Pap smear action/deferral: Ordered  (05/03/2009)   Pap smear due: 02/2008   Smoking status: current  (05/03/2009)   Smoking cessation counseling: yes  (10/06/2007)   Nursing Instructions: Pap smear today    Laboratory Results   Urine Tests  Date/Time Received: May 03, 2009 9:30 AM  Date/Time Reported: May 03, 2009 9:39 AM     Urine HCG: negative Comments: ...............test performed by......Marland KitchenBonnie A. Swaziland, MLS (ASCP)cm

## 2010-03-25 NOTE — Progress Notes (Signed)
Summary: phn msg  Phone Note Call from Patient Call back at Home Phone [phone]   Caller: Patient Summary of Call: mom has to go to hosp and cannot come to appt today - resch for Friday Initial call taken by: De Nurse,  April 30, 2009 10:08 AM

## 2010-03-25 NOTE — Letter (Signed)
Summary: Generic Letter  [facility]  [address]   [place], [place] [address]   Phone: [phone]  Fax: [phone]    05/07/2009  PRAGYA LOFASO [address] [place], [place]  [address]  Dear Ms. Delano,    Your PAP smear was normal      Sincerely,   Angelena Sole MD  Appended Document: Generic Letter mailed.

## 2010-03-25 NOTE — Assessment & Plan Note (Signed)
Summary: flu shot,tcb  Nurse Visit   Vital Signs:  Patient profile:   30 year old female Temp:     98.5 degrees F  Vitals Entered By: Theresia Lo RN (December 26, 2009 3:55 PM)  Allergies: No Known Drug Allergies  Immunizations Administered:  Influenza Vaccine # 1:    Vaccine Type: Fluvax 3+    Site: right deltoid    Mfr: GlaxoSmithKline    Dose: 0.5 ml    Route: IM    Given by: Theresia Lo RN    Exp. Date: 08/20/2010    Lot #: [id number]    VIS given: 09/17/09 version given December 26, 2009.  Flu Vaccine Consent Questions:    Do you have a history of severe allergic reactions to this vaccine? no    Any prior history of allergic reactions to egg and/or gelatin? no    Do you have a sensitivity to the preservative Thimersol? no    Do you have a past history of Guillan-Barre Syndrome? no    Do you currently have an acute febrile illness? no    Have you ever had a severe reaction to latex? no    Vaccine information given and explained to patient? yes    Are you currently pregnant? no  Orders Added: 1)  Flu Vaccine 17yrs + [[address]] 2)  Admin 1st Vaccine [[address]]

## 2010-03-25 NOTE — Progress Notes (Signed)
Summary: Rx Req  Phone Note Refill Request Call back at Home Phone [phone]   Refills Requested: Medication #1:  PRILOSEC 40 MG CPDR one by mouth daily  Medication #2:  LEXAPRO 10 MG TABS one by mouth daily PT USES [facility].  Initial call taken by: Clydell Hakim,  March 25, 2009 9:45 AM    Prescriptions: PRILOSEC 40 MG CPDR (OMEPRAZOLE) one by mouth daily  #30 x 11   Entered and Authorized by:   Angelena Sole MD   Signed by:   Angelena Sole MD on 03/27/2009   Method used:   Electronically to        [facility] (retail)       [address] [place], [place]  [address]       Ph: [phone]       Fax: [phone]   RxID:   [id number] LEXAPRO 10 MG TABS (ESCITALOPRAM OXALATE) one by mouth daily  #30 x 11   Entered and Authorized by:   Angelena Sole MD   Signed by:   Angelena Sole MD on 03/27/2009   Method used:   Electronically to        [facility] (retail)       [address]       [place], [place]  [address]       Ph: [phone]       Fax: [phone]   RxID:   [id number]

## 2010-07-07 ENCOUNTER — Emergency Department (HOSPITAL_COMMUNITY)
Admission: EM | Admit: 2010-07-07 | Discharge: 2010-07-07 | Disposition: A | Discharge status: Home / Self Care | Payer: Medicaid Other | Attending: Emergency Medicine | Admitting: Emergency Medicine

## 2010-07-07 DIAGNOSIS — K089 Disorder of teeth and supporting structures, unspecified: Secondary | ICD-10-CM | POA: Insufficient documentation

## 2010-10-30 ENCOUNTER — Telehealth: Payer: Self-pay | Admitting: Family Medicine

## 2010-10-30 NOTE — Telephone Encounter (Signed)
Evelyn Mccullough is out of her meds.  Have sched appt with provider for 9/17 @ 8:45.   Requesting refill until appt.  Pharmacy have faxed request.

## 2010-10-30 NOTE — Telephone Encounter (Signed)
Pt has not been seen in over a year.  I also have never met this pt.  She will have to wait until her appt unfortunately.  If she can get double booked for JUST a med refill, I can do that

## 2010-11-07 ENCOUNTER — Encounter: Payer: Self-pay | Admitting: Family Medicine

## 2010-11-07 ENCOUNTER — Ambulatory Visit (INDEPENDENT_AMBULATORY_CARE_PROVIDER_SITE_OTHER): Payer: Medicaid Other | Admitting: Family Medicine

## 2010-11-07 DIAGNOSIS — F410 Panic disorder [episodic paroxysmal anxiety] without agoraphobia: Secondary | ICD-10-CM

## 2010-11-07 DIAGNOSIS — Z23 Encounter for immunization: Secondary | ICD-10-CM

## 2010-11-07 DIAGNOSIS — K219 Gastro-esophageal reflux disease without esophagitis: Secondary | ICD-10-CM

## 2010-11-07 MED ORDER — ESCITALOPRAM OXALATE 10 MG PO TABS: 10.0000 mg | ORAL_TABLET | Freq: Every day | ORAL | Status: DC

## 2010-11-07 MED ORDER — OMEPRAZOLE 40 MG PO CPDR: 40.0000 mg | DELAYED_RELEASE_CAPSULE | Freq: Every day | ORAL | Status: DC

## 2010-11-07 MED ORDER — ALPRAZOLAM 1 MG PO TABS: 1.0000 mg | ORAL_TABLET | Freq: Three times a day (TID) | ORAL | Status: DC | PRN

## 2010-11-07 MED ORDER — ALPRAZOLAM 1 MG PO TABS: 1.0000 mg | ORAL_TABLET | Freq: Every evening | ORAL | Status: DC | PRN

## 2010-11-07 NOTE — Patient Instructions (Signed)
Please come back and see me in three months You will need to make sure to make an appt before you run out of medication

## 2010-11-07 NOTE — Assessment & Plan Note (Signed)
Taking lexapro 10 daily.  Using xanax 2-3 times/day, this has not cheanged in several years.  Not seeing therapist.  Refilled xanax today.  Would like to increase lexapro at next visit and start backing down on xanax

## 2010-11-07 NOTE — Progress Notes (Signed)
  Subjective:    Patient ID: Evelyn Mccullough, female    DOB: Jul 09, 1980, 30 y.o.   MRN: [id number]  HPI Pt here today for refill of xanax.  Takes 2-3 tablets daily for panic attack.  Trigger is GI symptoms.  Taking lexapro.  No therapy.  Has been tried on klonipin but felt too groggy.    GERD- pt has been treated with prilosec since they found she had a hiatal hernia.  She has no N/V/D.  No CP or SOB.   Review of Systems No HA or fever    Objective:   Physical Exam  Vital signs reviewed General appearance - alert, well appearing, and in no distress and oriented to person, place, and time Psych- normal interaction, eye contact, normal affect, well groomed.      Assessment & Plan:  ESOPHAGEAL REFLUX Doing well on prilosec. No nausea.  contiue current treatment  PANIC ATTACK Taking lexapro 10 daily.  Using xanax 2-3 times/day, this has not cheanged in several years.  Not seeing therapist.  Refilled xanax today.  Would like to increase lexapro at next visit and start backing down on xanax

## 2010-11-07 NOTE — Progress Notes (Signed)
Addended by: Jone Baseman D on: 11/07/2010 09:21 AM   Modules accepted: Orders

## 2010-11-07 NOTE — Assessment & Plan Note (Signed)
Doing well on prilosec. No nausea.  contiue current treatment

## 2010-11-10 ENCOUNTER — Ambulatory Visit: Payer: Self-pay | Admitting: Family Medicine

## 2010-11-20 LAB — POCT URINALYSIS DIP (DEVICE)
Bilirubin Urine: NEGATIVE
Glucose, UA: NEGATIVE
Hgb urine dipstick: NEGATIVE
Ketones, ur: NEGATIVE
pH: 6.5

## 2010-11-21 LAB — CBC
HCT: 34.3 — ABNORMAL LOW
HCT: 36.6
Hemoglobin: 12.4
MCHC: 34
MCHC: 34.5
MCV: 95.1
Platelets: 131 — ABNORMAL LOW
RBC: 3.85 — ABNORMAL LOW
RDW: 13.6
RDW: 13.9

## 2010-11-21 LAB — RH IMMUNE GLOB WKUP(>/=20WKS)(NOT WOMEN'S HOSP)

## 2011-02-26 ENCOUNTER — Ambulatory Visit (INDEPENDENT_AMBULATORY_CARE_PROVIDER_SITE_OTHER): Payer: Medicaid Other | Admitting: Family Medicine

## 2011-02-26 ENCOUNTER — Encounter: Payer: Self-pay | Admitting: Family Medicine

## 2011-02-26 VITALS — BP 114/80 | HR 96 | Ht 63.0 in | Wt 94.0 lb

## 2011-02-26 DIAGNOSIS — M79609 Pain in unspecified limb: Secondary | ICD-10-CM

## 2011-02-26 DIAGNOSIS — M79676 Pain in unspecified toe(s): Secondary | ICD-10-CM | POA: Insufficient documentation

## 2011-02-26 DIAGNOSIS — F411 Generalized anxiety disorder: Secondary | ICD-10-CM

## 2011-02-26 DIAGNOSIS — F410 Panic disorder [episodic paroxysmal anxiety] without agoraphobia: Secondary | ICD-10-CM

## 2011-02-26 MED ORDER — DICLOFENAC SODIUM 75 MG PO TBEC: 75.0000 mg | DELAYED_RELEASE_TABLET | Freq: Two times a day (BID) | ORAL | Status: AC | PRN

## 2011-02-26 MED ORDER — ALPRAZOLAM 1 MG PO TABS: 1.0000 mg | ORAL_TABLET | Freq: Two times a day (BID) | ORAL | Status: DC | PRN

## 2011-02-26 MED ORDER — ESCITALOPRAM OXALATE 20 MG PO TABS: 20.0000 mg | ORAL_TABLET | Freq: Every day | ORAL | Status: DC

## 2011-02-26 NOTE — Assessment & Plan Note (Signed)
See anxiety A/P. 

## 2011-02-26 NOTE — Progress Notes (Signed)
  Subjective:    Patient ID: Evelyn Mccullough, female    DOB: Sep 10, 1980, 31 y.o.   MRN: [id number]  HPI Patient is here for follow-up of anxiety.   Still taking Xanax 3 tablets daily. Not sure if Lexapro is helping Depression? More anxiety than depression. Denies SI/HI. Sometimes does feel overwhelmed with taking care of family and death in family 06-17-10. Also takes Xanax for symptoms of restless legs at nighttime.  2. Restless legs? Takes Xanax for this Alleviated by: being active during day time  3. Tobacco 1/2 ppd Knows she should quit but not ready until husband does (too much temptation)  4. Toe pain from feet being curled under Chronically walks with toes curled under Now having pain with prolonged walking Requesting medication for this  Review of Systems Per HPI    Objective:   Physical Exam Gen: appears disheveled; accompanied by 3 younger children Psych: engaged, appropriate   Appears mildly anxious and depressed   Alert and oriented   Not tearful Ext:   Toes partly curled under    No joint erythema, tenderness, swelling    Assessment & Plan:

## 2011-02-26 NOTE — Patient Instructions (Signed)
Increase your Lexapro to 20 mg daily Decrease Xanax to 1 tablet twice a day as needed  I think it would be a good idea to speak with our psychologist Dr. Pascal Lux about your anxiety and to see if therapy may help you  Consider stopping to smoke. I think it will help your leg pain.  We will refer you to a physical therapist for your feet pain You may also try diclofenac once daily as needed Also try soaking your feet in warm water  Follow-up in 1 month with Dr. Hulen Luster regarding your anxiety or sooner if needed

## 2011-02-26 NOTE — Assessment & Plan Note (Signed)
Still using Xanax tid for anxiety and restless leg syndrome symptoms. Anxiety seems to be from history of GI problems, eventually diagnosed as esophagitis. Still becomes anxious when using the restroom and takes Xanax for this and for restless leg syndrome symptoms.  Will increase Lexapro from 10 to 20 mg daily. Will decrease Xanax from tid to bid prn.  Will refer to Dr. Pascal Lux for evaluation. Patient has seen a therapist in the past who started her on the Xanax. Patient no longer sees the therapist due to failure to pay bills. However, I do not think Xanax is offering patient significant benefit at this time and think she would most benefit from psychotherapy. Patient seemed initially somewhat reluctant to see therapist, however, seemed to understand and amenable to the idea of weaning off Xanax.

## 2011-02-26 NOTE — Assessment & Plan Note (Addendum)
Chronic problem from chronically walking partially on toes of both feet. Complaining of worsening pain today without significant physical exam findings and requesting pain medications. Tried Tylenol 1000 mg bid prn and ibuprofen 400 mg several times a day and naproxen.  Will refer to PT, recommending soaking feet in warm water, and diclofenac prn.   Update: Instead of PT, podiatry referral will be more appropriate. I am having difficulty getting in touch with the patient (I called her cell phone number but the person who answered the phone said that their minutes are running out and the patient would not be able to communicate by phone at this time). I will send the patient a letter asking the patient to let me know if she would like to be referred to a foot specialist instead.

## 2011-03-04 NOTE — Progress Notes (Signed)
Addended by: Madolyn Frieze, Marylene Land J on: 03/04/2011 10:18 AM   Modules accepted: Orders

## 2011-03-27 ENCOUNTER — Ambulatory Visit: Payer: Self-pay | Admitting: Family Medicine

## 2011-04-02 ENCOUNTER — Other Ambulatory Visit: Payer: Self-pay | Admitting: Family Medicine

## 2011-04-02 DIAGNOSIS — K219 Gastro-esophageal reflux disease without esophagitis: Secondary | ICD-10-CM

## 2011-04-02 DIAGNOSIS — F410 Panic disorder [episodic paroxysmal anxiety] without agoraphobia: Secondary | ICD-10-CM

## 2011-04-02 MED ORDER — ALPRAZOLAM 1 MG PO TABS: ORAL_TABLET | ORAL | Status: DC

## 2011-04-02 MED ORDER — ESCITALOPRAM OXALATE 20 MG PO TABS: 20.0000 mg | ORAL_TABLET | Freq: Every day | ORAL | Status: DC

## 2011-04-02 MED ORDER — OMEPRAZOLE 40 MG PO CPDR: 40.0000 mg | DELAYED_RELEASE_CAPSULE | Freq: Every day | ORAL | Status: DC

## 2011-04-02 NOTE — Telephone Encounter (Signed)
Patient is hoping for refills on Lexapro, Prilosec and Alprazolam.  She knows she needs to make an appt, but is waiting for her Medicaid to be re-instated.  Please give her a call with the decision.

## 2011-04-02 NOTE — Telephone Encounter (Signed)
Attempted to call pt and let her know but she did not answer and had no voicemail.  Will await callback, admin please inform when she calls back. Fleeger, Maryjo Rochester

## 2011-04-02 NOTE — Telephone Encounter (Signed)
Will taper pt off xanax.  Gave info for psych therapy.  See letter.  precepted with Dr. Sw[place].

## 2011-04-02 NOTE — Telephone Encounter (Signed)
Pt states that she was just here in January and couldn't see her PCP-was asked if she would see [facility].  She is in the process of getting Medicaid ( should be going thru soon) and just doesn't have the money right now.  Has made appt for beginning of march and wants to know if she can get enough until appt.

## 2011-04-02 NOTE — Telephone Encounter (Signed)
Refilled lexapro, prilosec.  Unable to give xanax refills without appt due to clinic policy.

## 2011-04-27 ENCOUNTER — Ambulatory Visit: Payer: Self-pay | Admitting: Family Medicine

## 2011-05-01 ENCOUNTER — Encounter: Payer: Self-pay | Admitting: Family Medicine

## 2011-05-01 ENCOUNTER — Ambulatory Visit (INDEPENDENT_AMBULATORY_CARE_PROVIDER_SITE_OTHER): Payer: Self-pay | Admitting: Family Medicine

## 2011-05-01 ENCOUNTER — Other Ambulatory Visit: Payer: Self-pay | Admitting: Family Medicine

## 2011-05-01 ENCOUNTER — Ambulatory Visit: Payer: Self-pay | Admitting: Family Medicine

## 2011-05-01 DIAGNOSIS — F411 Generalized anxiety disorder: Secondary | ICD-10-CM

## 2011-05-01 DIAGNOSIS — F410 Panic disorder [episodic paroxysmal anxiety] without agoraphobia: Secondary | ICD-10-CM

## 2011-05-01 MED ORDER — ALPRAZOLAM 1 MG PO TABS: ORAL_TABLET | ORAL | Status: DC

## 2011-05-01 NOTE — Progress Notes (Signed)
  Subjective:    Patient ID: Evelyn Mccullough, female    DOB: 03/06/1980, 31 y.o.   MRN: [id number]  HPI Ms. Sanzone is a 31 yo F with PMHx of anxiety who presents to PCP for evaluation of anxiety and med refills.  Her lexapro was recently increased from 10 mg to 20 mg at a visit 32 month ago.  She reports that her symptoms have not decreased and says that she continues to have anxiety attacks every few days.  She reports that during her attacks she takes 1/2 to 1 tablet of xanax which makes her feel better.  Pt states that she also has restless leg syndrome and will take a xanax occasionally for difficulties with sleep. Pt also reports that she is experiencing abdominal discomfort and pain which is relieved by having a BM.  She says that this has been going on for several years.  She reports that she has anxiety surrounding these symptoms. Pt was supposed to see PCP today, but PCP had to cancel clinic today so was placed in my clinic for evaluation.  Review of Systems  All other systems reviewed and are negative.       Objective:   Physical Exam  Constitutional: She appears cachectic. No distress.       Patient is disheveled and appears older than stated age; very poor dentition  Cardiovascular: Normal rate, regular rhythm and normal heart sounds.  Exam reveals no gallop and no friction rub.   No murmur heard. Pulmonary/Chest: Effort normal and breath sounds normal. No respiratory distress. She has no wheezes. She has no rales.  Abdominal: Soft. Bowel sounds are normal. She exhibits no distension. There is no tenderness. There is no rebound and no guarding.  Skin: She is not diaphoretic.   Blood pressure 113/71, pulse 94, height 5\' 3"  (1.6 m), weight 92 lb 4.8 oz (41.867 kg).      Assessment & Plan:

## 2011-05-04 NOTE — Assessment & Plan Note (Addendum)
Will agree to give patient 15 tablets of xanax to get her through until she can see her PCP.  It looks like at previous visits, the goal has been to slowly titrate pt off of the xanax.  Discussed this with patient.  Pt to return for f/up with PCP to discuss in more detail. Pt to continue to take lexapro as directed.

## 2011-05-06 ENCOUNTER — Ambulatory Visit: Payer: Self-pay | Admitting: Family Medicine

## 2011-06-07 ENCOUNTER — Encounter (HOSPITAL_COMMUNITY): Payer: Self-pay

## 2011-06-07 ENCOUNTER — Emergency Department (HOSPITAL_COMMUNITY): Payer: Medicaid Other

## 2011-06-07 ENCOUNTER — Emergency Department (HOSPITAL_COMMUNITY)
Admission: EM | Admit: 2011-06-07 | Discharge: 2011-06-07 | Disposition: A | Discharge status: Home / Self Care | Payer: Medicaid Other | Attending: Emergency Medicine | Admitting: Emergency Medicine

## 2011-06-07 DIAGNOSIS — Z79899 Other long term (current) drug therapy: Secondary | ICD-10-CM | POA: Insufficient documentation

## 2011-06-07 DIAGNOSIS — S9030XA Contusion of unspecified foot, initial encounter: Secondary | ICD-10-CM | POA: Insufficient documentation

## 2011-06-07 DIAGNOSIS — Y92009 Unspecified place in unspecified non-institutional (private) residence as the place of occurrence of the external cause: Secondary | ICD-10-CM | POA: Insufficient documentation

## 2011-06-07 DIAGNOSIS — W208XXA Other cause of strike by thrown, projected or falling object, initial encounter: Secondary | ICD-10-CM | POA: Insufficient documentation

## 2011-06-07 DIAGNOSIS — M79609 Pain in unspecified limb: Secondary | ICD-10-CM | POA: Insufficient documentation

## 2011-06-07 DIAGNOSIS — S8990XA Unspecified injury of unspecified lower leg, initial encounter: Secondary | ICD-10-CM | POA: Insufficient documentation

## 2011-06-07 DIAGNOSIS — S9031XA Contusion of right foot, initial encounter: Secondary | ICD-10-CM

## 2011-06-07 MED ORDER — OXYCODONE-ACETAMINOPHEN 5-325 MG PO TABS: 1.0000 | ORAL_TABLET | Freq: Four times a day (QID) | ORAL | Status: AC | PRN

## 2011-06-07 MED ORDER — OXYCODONE-ACETAMINOPHEN 5-325 MG PO TABS
1.0000 | ORAL_TABLET | Freq: Once | ORAL | Status: AC
  Administered 2011-06-07: 1 via ORAL
  Filled 2011-06-07: qty 1

## 2011-06-07 MED ORDER — IBUPROFEN 400 MG PO TABS
400.0000 mg | ORAL_TABLET | Freq: Once | ORAL | Status: AC
  Administered 2011-06-07: 400 mg via ORAL
  Filled 2011-06-07: qty 1

## 2011-06-07 MED ORDER — IBUPROFEN 400 MG PO TABS: 400.0000 mg | ORAL_TABLET | Freq: Four times a day (QID) | ORAL | Status: AC | PRN

## 2011-06-07 NOTE — ED Provider Notes (Signed)
History     CSN: [id number]  Arrival date & time 06/07/11  1348   First MD Initiated Contact with Patient 06/07/11 1526      Chief Complaint  Patient presents with  . Foot Injury    (Consider location/radiation/quality/duration/timing/severity/associated sxs/prior treatment) Patient is a 31 y.o. female presenting with foot injury. The history is provided by the patient.  Foot Injury  The incident occurred yesterday. The incident occurred at home. The injury mechanism was a direct blow (She dropped a tv on her right foot last night). The pain is present in the right foot. The quality of the pain is described as throbbing. The pain is at a severity of 10/10. The pain is severe. The pain has been constant since onset. Pertinent negatives include no numbness, no inability to bear weight, no loss of motion, no loss of sensation and no tingling. She reports no foreign bodies present. The symptoms are aggravated by bearing weight. She has tried ice and rest for the symptoms. The treatment provided no relief.    History reviewed. No pertinent past medical history.  History reviewed. No pertinent past surgical history.  No family history on file.  History  Substance Use Topics  . Smoking status: Current Everyday Smoker -- 0.5 packs/day    Types: Cigarettes  . Smokeless tobacco: Not on file   Comment: wants to quitt with husband  . Alcohol Use: No    OB History    Grav Para Term Preterm Abortions TAB SAB Ect Mult Living                  Review of Systems  Constitutional: Negative for fever.  HENT: Negative for congestion, sore throat and neck pain.   Eyes: Negative.   Respiratory: Negative for chest tightness and shortness of breath.   Cardiovascular: Negative for chest pain.  Gastrointestinal: Negative for nausea and abdominal pain.  Genitourinary: Negative.   Musculoskeletal: Positive for arthralgias. Negative for joint swelling.  Skin: Negative.  Negative for rash and  wound.  Neurological: Negative for dizziness, tingling, weakness, light-headedness, numbness and headaches.  Hematological: Negative.   Psychiatric/Behavioral: Negative.     Allergies  Review of patient's allergies indicates no known allergies.  Home Medications   Current Outpatient Rx  Name Route Sig Dispense Refill  . ALPRAZOLAM 1 MG PO TABS  Take one daily for 1 week then 1/2 tab daily for one week then stop 15 tablet 0  . BENZOYL PEROXIDE 2.5 % EX GEL Topical Apply topically 2 (two) times daily. For acne     . DICLOFENAC SODIUM 75 MG PO TBEC Oral Take 1 tablet (75 mg total) by mouth 2 (two) times daily as needed. 28 tablet 0  . ESCITALOPRAM OXALATE 20 MG PO TABS Oral Take 1 tablet (20 mg total) by mouth daily. 30 tablet 11  . IBUPROFEN 400 MG PO TABS Oral Take 1 tablet (400 mg total) by mouth every 6 (six) hours as needed for pain. 30 tablet 0  . OMEPRAZOLE 40 MG PO CPDR Oral Take 1 capsule (40 mg total) by mouth daily. 30 capsule 11  . OXYCODONE-ACETAMINOPHEN 5-325 MG PO TABS Oral Take 1 tablet by mouth every 6 (six) hours as needed for pain. 10 tablet 0    BP 116/67  Pulse 99  Temp(Src) 98.4 F (36.9 C) (Oral)  Resp 20  Ht 5\' 3"  (1.6 m)  Wt 100 lb (45.36 kg)  BMI 17.71 kg/m2  SpO2 100%  Physical Exam  Nursing note and vitals reviewed. Constitutional: She is oriented to person, place, and time. She appears well-developed and well-nourished.  HENT:  Head: Normocephalic.  Eyes: Conjunctivae are normal.  Neck: Normal range of motion.  Cardiovascular: Normal rate and intact distal pulses.  Exam reveals no decreased pulses.   Pulses:      Dorsalis pedis pulses are 2+ on the right side, and 2+ on the left side.       Posterior tibial pulses are 2+ on the right side, and 2+ on the left side.  Pulmonary/Chest: Effort normal.  Musculoskeletal: She exhibits tenderness. She exhibits no edema.       Right foot: She exhibits tenderness. She exhibits normal range of motion, no  swelling, normal capillary refill, no crepitus and no deformity.       Feet:  Neurological: She is alert and oriented to person, place, and time. No sensory deficit.  Skin: Skin is warm, dry and intact.    ED Course  Procedures (including critical care time)  Labs Reviewed - No data to display Dg Foot Complete Right  06/07/2011  *RADIOLOGY REPORT*  Clinical Data: Swelling and bruising  RIGHT FOOT COMPLETE - 3+ VIEW  Comparison: None.  Findings: No evidence of fracture or dislocation of the midfoot or forefoot.  Phalanges normal.  No soft tissue abnormality.  IMPRESSION: No evidence of fracture.  Original Report Authenticated By: Genevive Bi, M.D.     1. Contusion of foot, right       MDM  Ibuprofen tid,  Ice,  Elevation.  Small quantity of oxycodone prescribed.  Ace wrap,  RICE.  F/u pcp x 1 week if not improved.        Candis Musa, PA 06/08/11 2321

## 2011-06-07 NOTE — ED Notes (Signed)
Pt states that she dropped a tv on her foot last night, able to walk back to room

## 2011-06-07 NOTE — ED Notes (Signed)
Pt presents with right foot pain after dropping TV on foot last night. Pt states there is swelling to foot. No swelling noted. No deformity noted either.

## 2011-06-07 NOTE — Discharge Instructions (Signed)
Contusion (Bruise) of Foot Injury to the foot causes bruises (contusions). Contusions are caused by bleeding from small blood vessels that allow blood to leak out into the muscles, cord-like structures that attach muscle to bone (tendons), and/or other soft tissue.  CAUSES  Contusions of the foot are common. Bruises are frequently seen from:  Contact sports injuries.   The use of medications that thin the blood (anti-coagulants).   Aspirin and non-steroidal anti-inflammatory agents that decrease the clotting ability.   People with vitamin deficiencies.  SYMPTOMS  Signs of foot injury include pain and swelling. At first there may be discoloration from blood under the skin. This will appear blue to purple in color. As the bruise ages, the color turns yellow. Swelling may limit the movement of the toes.  Complications from foot injury may include:  Collections of blood leading to disability if calcium deposits form. These can later limit movement in the foot.   Infection of the foot if there are breaks in the skin.   Rupture of the tendons that may need surgical repair.  DIAGNOSIS  Diagnosing foot injuries can be made by observation. If problems continue, X-rays may be needed to make sure there are no broken bones (fractures). Continuing problems may require physical therapy.  HOME CARE INSTRUCTIONS   Apply ice to the injury for 15 to 20 minutes, 3 to 4 times per day. Put the ice in a plastic bag and place a towel between the bag of ice and your skin.   An elastic wrap (like an Ace bandage) may be used to keep swelling down.   Keep foot elevated to reduce swelling and discomfort.   Try to avoid standing or walking while the foot is painful. Do not resume use until instructed by your caregiver. Then begin use gradually. If pain develops, decrease use and continue the above measures. Gradually increase activities that do not cause discomfort until you slowly have normal use.   Only take  over-the-counter or prescription medicines for pain, discomfort, or fever as directed by your caregiver. Use only if your caregiver has not given medications that would interfere.   Begin daily rehabilitation exercises when supportive wrapping is no longer needed.   Use ice massage for 10 minutes before and after workouts. Fill a large styrofoam cup with water and freeze. Tear a small amount of foam from the top so ice protrudes. Massage ice firmly over the injured area in a circle about the size of a softball.   Always eat a well balanced diet.   Follow all instructions for follow up with your caregiver, any orthopedic referrals, physical therapy and rehabilitation. Any delay in obtaining necessary care could result in delayed healing, and temporary or permanent disability.  SEEK IMMEDIATE MEDICAL CARE IF:   Your pain and swelling increase, or pain is uncontrolled with medications.   You have loss of feeling in your foot, or your foot turns cold or blue.   An oral temperature above 102 F (38.9 C) develops, not controlled by medication.   Your foot becomes warm to touch, or you have more pain with movement of your toes.   You have a foot contusion that does not improve in 1 or 2 days.   Skin is broken and signs of infection occur (drainage, increasing pain, fever, headache, muscle aches, dizziness or a general ill feeling).   You develop new, unexplained symptoms, or an increase of the symptoms that brought you to your caregiver.  MAKE SURE YOU:     Understand these instructions.   Will watch your condition.   Will get help right away if you are not doing well or get worse.  Document Released: 12/01/2005 Document Revised: 01/29/2011 Document Reviewed: 01/13/2011 [facility] Patient Information 2012 [place], [place].   You may take the oxycodone prescribed for pain relief.  This will make you drowsy - do not drive within 4 hours of taking this medication.

## 2011-06-09 NOTE — ED Provider Notes (Signed)
Medical screening examination/treatment/procedure(s) were performed by non-physician practitioner and as supervising physician I was immediately available for consultation/collaboration.   Glynn Octave, MD 06/09/11 1346

## 2012-02-28 ENCOUNTER — Encounter (HOSPITAL_COMMUNITY): Payer: Self-pay | Admitting: *Deleted

## 2012-02-28 ENCOUNTER — Emergency Department (HOSPITAL_COMMUNITY)
Admission: EM | Admit: 2012-02-28 | Discharge: 2012-02-28 | Disposition: A | Discharge status: Home / Self Care | Payer: Self-pay | Attending: Emergency Medicine | Admitting: Emergency Medicine

## 2012-02-28 ENCOUNTER — Emergency Department (HOSPITAL_COMMUNITY): Payer: Self-pay

## 2012-02-28 DIAGNOSIS — S0003XA Contusion of scalp, initial encounter: Secondary | ICD-10-CM | POA: Insufficient documentation

## 2012-02-28 DIAGNOSIS — Z23 Encounter for immunization: Secondary | ICD-10-CM | POA: Insufficient documentation

## 2012-02-28 DIAGNOSIS — S0083XA Contusion of other part of head, initial encounter: Secondary | ICD-10-CM | POA: Insufficient documentation

## 2012-02-28 DIAGNOSIS — S5000XA Contusion of unspecified elbow, initial encounter: Secondary | ICD-10-CM | POA: Insufficient documentation

## 2012-02-28 DIAGNOSIS — S0093XA Contusion of unspecified part of head, initial encounter: Secondary | ICD-10-CM

## 2012-02-28 DIAGNOSIS — Z79899 Other long term (current) drug therapy: Secondary | ICD-10-CM | POA: Insufficient documentation

## 2012-02-28 DIAGNOSIS — F17200 Nicotine dependence, unspecified, uncomplicated: Secondary | ICD-10-CM | POA: Insufficient documentation

## 2012-02-28 DIAGNOSIS — S20219A Contusion of unspecified front wall of thorax, initial encounter: Secondary | ICD-10-CM | POA: Insufficient documentation

## 2012-02-28 MED ORDER — TETANUS-DIPHTH-ACELL PERTUSSIS 5-2.5-18.5 LF-MCG/0.5 IM SUSP
0.5000 mL | Freq: Once | INTRAMUSCULAR | Status: AC
  Administered 2012-02-28: 0.5 mL via INTRAMUSCULAR
  Filled 2012-02-28: qty 0.5

## 2012-02-28 MED ORDER — IBUPROFEN 800 MG PO TABS: 800.0000 mg | ORAL_TABLET | Freq: Three times a day (TID) | ORAL | Status: DC | PRN
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
## 2012-05-30 ENCOUNTER — Encounter (HOSPITAL_COMMUNITY): Payer: Self-pay | Admitting: Emergency Medicine

## 2012-05-30 ENCOUNTER — Emergency Department (HOSPITAL_COMMUNITY)
Admission: EM | Admit: 2012-05-30 | Discharge: 2012-05-30 | Disposition: A | Discharge status: Home / Self Care | Payer: Self-pay | Attending: Emergency Medicine | Admitting: Emergency Medicine

## 2012-05-30 DIAGNOSIS — F17200 Nicotine dependence, unspecified, uncomplicated: Secondary | ICD-10-CM | POA: Insufficient documentation

## 2012-05-30 DIAGNOSIS — R197 Diarrhea, unspecified: Secondary | ICD-10-CM | POA: Insufficient documentation

## 2012-05-30 DIAGNOSIS — R109 Unspecified abdominal pain: Secondary | ICD-10-CM | POA: Insufficient documentation

## 2012-05-30 DIAGNOSIS — Z79899 Other long term (current) drug therapy: Secondary | ICD-10-CM | POA: Insufficient documentation

## 2012-05-30 DIAGNOSIS — R112 Nausea with vomiting, unspecified: Secondary | ICD-10-CM | POA: Insufficient documentation

## 2012-05-30 MED ORDER — ONDANSETRON HCL 4 MG/2ML IJ SOLN
4.0000 mg | Freq: Once | INTRAMUSCULAR | Status: AC
  Administered 2012-05-30: 4 mg via INTRAVENOUS
  Filled 2012-05-30: qty 2

## 2012-05-30 MED ORDER — SODIUM CHLORIDE 0.9 % IV BOLUS (SEPSIS)
1000.0000 mL | Freq: Once | INTRAVENOUS | Status: AC
  Administered 2012-05-30: 1000 mL via INTRAVENOUS

## 2012-05-30 MED ORDER — ONDANSETRON 4 MG PO TBDP: 4.0000 mg | ORAL_TABLET | Freq: Three times a day (TID) | ORAL | Status: DC | PRN

## 2012-05-30 MED ORDER — MORPHINE SULFATE 4 MG/ML IJ SOLN
2.0000 mg | Freq: Once | INTRAMUSCULAR | Status: AC
  Administered 2012-05-30: 2 mg via INTRAVENOUS
  Filled 2012-05-30: qty 1

## 2012-05-30 NOTE — ED Notes (Signed)
Patient given ginger ale to drink and vomited x 1 after drinking.
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
## 2012-05-30 NOTE — ED Notes (Signed)
Patient with no complaints at this time. Respirations even and unlabored. Skin warm/dry. Discharge instructions reviewed with patient at this time. Patient given opportunity to voice concerns/ask questions. IV removed per policy and band-aid applied to site. Patient discharged at this time and left Emergency Department with steady gait.  

## 2012-05-30 NOTE — ED Notes (Signed)
Allowing IVF to finish prior to d/c.

## 2012-05-30 NOTE — ED Notes (Signed)
Dr. Colon Branch notified of patient vomiting after drinking fluid.

## 2012-05-30 NOTE — ED Notes (Signed)
Patient complaining of nausea, vomiting, abdominal pain, and diarrhea since Saturday night.

## 2012-10-27 ENCOUNTER — Emergency Department (INDEPENDENT_AMBULATORY_CARE_PROVIDER_SITE_OTHER)
Admission: EM | Admit: 2012-10-27 | Discharge: 2012-10-27 | Disposition: A | Discharge status: Home / Self Care | Payer: Self-pay | Source: Home / Self Care

## 2012-10-27 ENCOUNTER — Encounter (HOSPITAL_COMMUNITY): Payer: Self-pay | Admitting: Emergency Medicine

## 2012-10-27 DIAGNOSIS — S139XXA Sprain of joints and ligaments of unspecified parts of neck, initial encounter: Secondary | ICD-10-CM

## 2012-10-27 DIAGNOSIS — S161XXA Strain of muscle, fascia and tendon at neck level, initial encounter: Secondary | ICD-10-CM

## 2012-10-27 MED ORDER — TRAMADOL HCL 50 MG PO TABS: 50.0000 mg | ORAL_TABLET | Freq: Four times a day (QID) | ORAL | Status: DC | PRN
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
## 2012-10-27 NOTE — ED Provider Notes (Signed)
Medical screening examination/treatment/procedure(s) were performed by a resident physician or non-physician practitioner and as the supervising physician I was immediately available for consultation/collaboration.  Kaiulani Sitton, MD   Janea Schwenn S Quin Mcpherson, MD 10/27/12 1541 

## 2013-01-23 ENCOUNTER — Ambulatory Visit: Payer: Self-pay | Admitting: Family Medicine

## 2013-01-30 ENCOUNTER — Ambulatory Visit: Payer: Self-pay | Admitting: Family Medicine

## 2013-02-08 ENCOUNTER — Ambulatory Visit: Payer: Self-pay | Admitting: Family Medicine

## 2013-03-09 ENCOUNTER — Emergency Department (HOSPITAL_COMMUNITY)
Admission: EM | Admit: 2013-03-09 | Discharge: 2013-03-09 | Disposition: A | Discharge status: Home / Self Care | Payer: BC Managed Care – PPO | Attending: Emergency Medicine | Admitting: Emergency Medicine

## 2013-03-09 ENCOUNTER — Encounter (HOSPITAL_COMMUNITY): Payer: Self-pay | Admitting: Emergency Medicine

## 2013-03-09 DIAGNOSIS — R509 Fever, unspecified: Secondary | ICD-10-CM | POA: Insufficient documentation

## 2013-03-09 DIAGNOSIS — K5289 Other specified noninfective gastroenteritis and colitis: Secondary | ICD-10-CM | POA: Insufficient documentation

## 2013-03-09 DIAGNOSIS — Z3202 Encounter for pregnancy test, result negative: Secondary | ICD-10-CM | POA: Insufficient documentation

## 2013-03-09 DIAGNOSIS — K529 Noninfective gastroenteritis and colitis, unspecified: Secondary | ICD-10-CM

## 2013-03-09 DIAGNOSIS — F17200 Nicotine dependence, unspecified, uncomplicated: Secondary | ICD-10-CM | POA: Insufficient documentation

## 2013-03-09 DIAGNOSIS — E860 Dehydration: Secondary | ICD-10-CM

## 2013-03-09 DIAGNOSIS — Z79899 Other long term (current) drug therapy: Secondary | ICD-10-CM | POA: Insufficient documentation

## 2013-03-09 DIAGNOSIS — R52 Pain, unspecified: Secondary | ICD-10-CM | POA: Insufficient documentation

## 2013-03-09 LAB — URINALYSIS, ROUTINE W REFLEX MICROSCOPIC
Glucose, UA: NEGATIVE mg/dL
Leukocytes, UA: NEGATIVE
NITRITE: NEGATIVE
Protein, ur: 30 mg/dL — AB
SPECIFIC GRAVITY, URINE: 1.025 (ref 1.005–1.030)
UROBILINOGEN UA: 0.2 mg/dL (ref 0.0–1.0)
pH: 6 (ref 5.0–8.0)

## 2013-03-09 LAB — URINE MICROSCOPIC-ADD ON

## 2013-03-09 LAB — POCT PREGNANCY, URINE: Preg Test, Ur: NEGATIVE

## 2013-03-09 MED ORDER — ONDANSETRON HCL 4 MG/2ML IJ SOLN
4.0000 mg | Freq: Once | INTRAMUSCULAR | Status: DC
  Filled 2013-03-09: qty 2

## 2013-03-09 MED ORDER — SODIUM CHLORIDE 0.9 % IV SOLN
Freq: Once | INTRAVENOUS | Status: AC
  Administered 2013-03-09: 08:00:00 via INTRAVENOUS

## 2013-03-09 MED ORDER — PROMETHAZINE HCL 25 MG RE SUPP: 25.0000 mg | Freq: Four times a day (QID) | RECTAL | Status: DC | PRN

## 2013-03-09 MED ORDER — MORPHINE SULFATE 4 MG/ML IJ SOLN
4.0000 mg | Freq: Once | INTRAMUSCULAR | Status: AC
  Administered 2013-03-09: 4 mg via INTRAVENOUS
  Filled 2013-03-09: qty 1

## 2013-03-09 MED ORDER — PROMETHAZINE HCL 25 MG PO TABS: 25.0000 mg | ORAL_TABLET | Freq: Four times a day (QID) | ORAL | Status: DC | PRN

## 2013-03-09 MED ORDER — ONDANSETRON HCL 4 MG/2ML IJ SOLN
4.0000 mg | Freq: Once | INTRAMUSCULAR | Status: AC
  Administered 2013-03-09: 4 mg via INTRAVENOUS

## 2013-03-09 MED ORDER — SODIUM CHLORIDE 0.9 % IV BOLUS (SEPSIS)
1000.0000 mL | Freq: Once | INTRAVENOUS | Status: AC
  Administered 2013-03-09: 1000 mL via INTRAVENOUS

## 2013-03-09 MED ORDER — ACETAMINOPHEN 325 MG PO TABS
650.0000 mg | ORAL_TABLET | Freq: Once | ORAL | Status: AC
  Administered 2013-03-09: 650 mg via ORAL
  Filled 2013-03-09: qty 2

## 2013-03-09 MED ORDER — ONDANSETRON HCL 4 MG/2ML IJ SOLN
4.0000 mg | Freq: Once | INTRAMUSCULAR | Status: AC
  Administered 2013-03-09: 4 mg via INTRAVENOUS
  Filled 2013-03-09: qty 2

## 2013-03-09 NOTE — ED Provider Notes (Signed)
CSN: [id number]     Arrival date & time 03/09/13  0807 History  This chart was scribed for Joya Gaskinsonald W Gailene Youkhana, MD by Quintella ReichertMatthew Underwood, ED scribe.  This patient was seen in room APA03/APA03 and the patient's care was started at 8:15 AM.   Chief Complaint  Patient presents with  . Emesis  . Fever  . Generalized Body Aches    Patient is a 33 y.o. female presenting with vomiting. The history is provided by the patient. No language interpreter was used.  Emesis Severity:  Moderate Duration:  2 days Timing: persistent. Progression:  Worsening Chronicity:  New Associated symptoms: abdominal pain, diarrhea, fever and myalgias   Associated symptoms: no cough     HPI Comments: Evelyn Mccullough is a 33 y.o. female with no chronic medical conditions who presents to the Emergency Department complaining of 2 of persistent worsening abdominal pain, nausea, vomiting and diarrhea with associated fevers and generalized body aches.  Pt states she has been having multiple episodes/day of both vomiting and diarrhea.  She reports she has not been able to keep anything down in 2 days and her mouth feels dry.  She denies blood in stool, cough, hematuria, or vaginal bleeding or discharge.  She denies recent travel, sick contacts, or antibiotic usage.   PMH - none    History  Substance Use Topics  . Smoking status: Current Every Day Smoker -- 0.50 packs/day for 10 years    Types: Cigarettes  . Smokeless tobacco: Never Used     Comment: wants to quitt with husband  . Alcohol Use: No    OB History   Grav Para Term Preterm Abortions TAB SAB Ect Mult Living   3 3 3       3       Review of Systems  Constitutional: Positive for fever.  Respiratory: Negative for cough.   Gastrointestinal: Positive for vomiting, abdominal pain and diarrhea. Negative for blood in stool.  Genitourinary: Negative for hematuria, vaginal bleeding and vaginal discharge.  Musculoskeletal: Positive for myalgias.  All other  systems reviewed and are negative.     Allergies  Review of patient's allergies indicates no known allergies.  Home Medications   Current Outpatient Rx  Name  Route  Sig  Dispense  Refill  . ALPRAZolam (XANAX) 1 MG tablet      Take one daily for 1 week then 1/2 tab daily for one week then stop   15 tablet   0   . Benzoyl Peroxide 2.5 % gel   Topical   Apply topically 2 (two) times daily. For acne          . EXPIRED: escitalopram (LEXAPRO) 20 MG tablet   Oral   Take 1 tablet (20 mg total) by mouth daily.   30 tablet   11   . ibuprofen (ADVIL,MOTRIN) 800 MG tablet   Oral   Take 1 tablet (800 mg total) by mouth every 8 (eight) hours as needed for pain (pain).   21 tablet   0   . EXPIRED: omeprazole (PRILOSEC) 40 MG capsule   Oral   Take 1 capsule (40 mg total) by mouth daily.   30 capsule   11   . ondansetron (ZOFRAN ODT) 4 MG disintegrating tablet   Oral   Take 1 tablet (4 mg total) by mouth every 8 (eight) hours as needed for nausea.   20 tablet   0   . traMADol (ULTRAM) 50 MG tablet   Oral  Take 1 tablet (50 mg total) by mouth every 6 (six) hours as needed for pain.   15 tablet   0    BP 112/59  Pulse 67  Temp(Src) 97.7 F (36.5 C) (Oral)  Resp 16  Ht 5\' 3"  (1.6 m)  Wt 100 lb (45.36 kg)  BMI 17.72 kg/m2   Physical Exam CONSTITUTIONAL: Well developed/well nourished HEAD: Normocephalic/atraumatic EYES: EOMI/PERRL, no icterus ENMT: Mucous membranes dry, poor dentition NECK: supple no meningeal signs SPINE:entire spine nontender CV: S1/S2 noted, no murmurs/rubs/gallops noted LUNGS: Lungs are clear to auscultation bilaterally, no apparent distress ABDOMEN: soft, nontender, no rebound or guarding GU:no cva tenderness NEURO: Pt is awake/alert, moves all extremitiesx4 EXTREMITIES: pulses normal, full ROM SKIN: warm, color normal PSYCH: no abnormalities of mood noted   ED Course  Procedures (including critical care time)   COORDINATION OF  CARE: 8:19 AM-Discussed treatment plan which includes IV fluids, pain medication, anti-emetics, and labs with pt at bedside and pt agreed to plan.    11:28 AM Pt improving, taking PO, stable for d/c, suspect viral gastroenteritis   Labs Review Labs Reviewed  URINALYSIS, ROUTINE W REFLEX MICROSCOPIC - Abnormal; Notable for the following:    Hgb urine dipstick MODERATE (*)    Bilirubin Urine MODERATE (*)    Ketones, ur >80 (*)    Protein, ur 30 (*)    All other components within normal limits  URINE MICROSCOPIC-ADD ON - Abnormal; Notable for the following:    Squamous Epithelial / LPF FEW (*)    Bacteria, UA FEW (*)    All other components within normal limits  POCT PREGNANCY, URINE    Imaging Review No results found.   EKG Interpretation   None       MDM  No diagnosis found. Nursing notes including past medical history and social history reviewed and considered in documentation Labs/vital reviewed and considered     I personally performed the services described in this documentation, which was scribed in my presence. The recorded information has been reviewed and is accurate.      Joya Gaskins, MD 03/09/13 1128

## 2013-03-09 NOTE — ED Notes (Signed)
Patient brought in via EMS from home. Alert and oriented. Airway patent. Patient c/o flu like symptoms x3 days. Reports fever, nausea, vomiting, and body aches. No active vomiting noted at this time.

## 2013-03-09 NOTE — ED Notes (Signed)
Patient waiting for fluid bolus to complete per MD order.

## 2013-03-09 NOTE — ED Notes (Signed)
Reminded patient that a urine specimen is needed. States that she has not eaten or drank anything in about two days

## 2013-03-22 IMAGING — CR DG FOOT COMPLETE 3+V*R*
3 series · 3 of 3 positions shown · non-contrast
Comparison: None.

CLINICAL DATA: Swelling and bruising

RIGHT FOOT COMPLETE - 3+ VIEW

[view not recorded (1 of 3)]
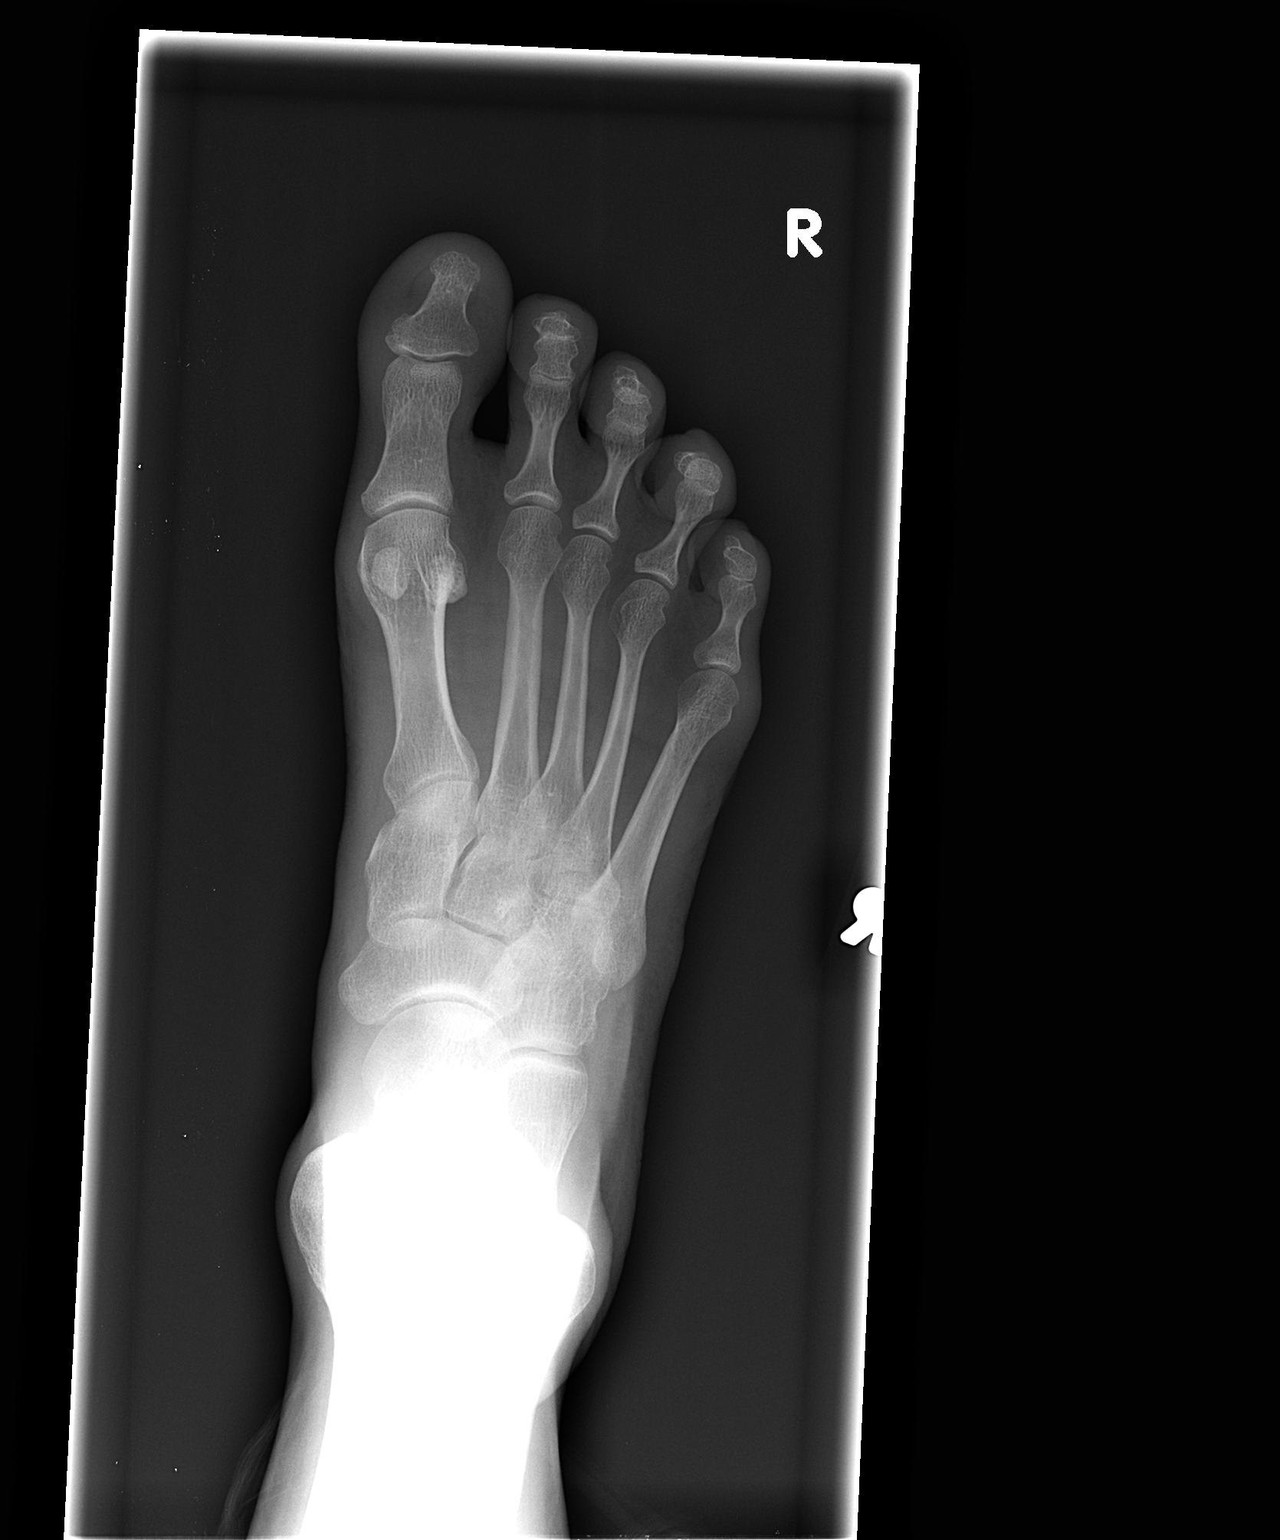

[view not recorded (2 of 3)]
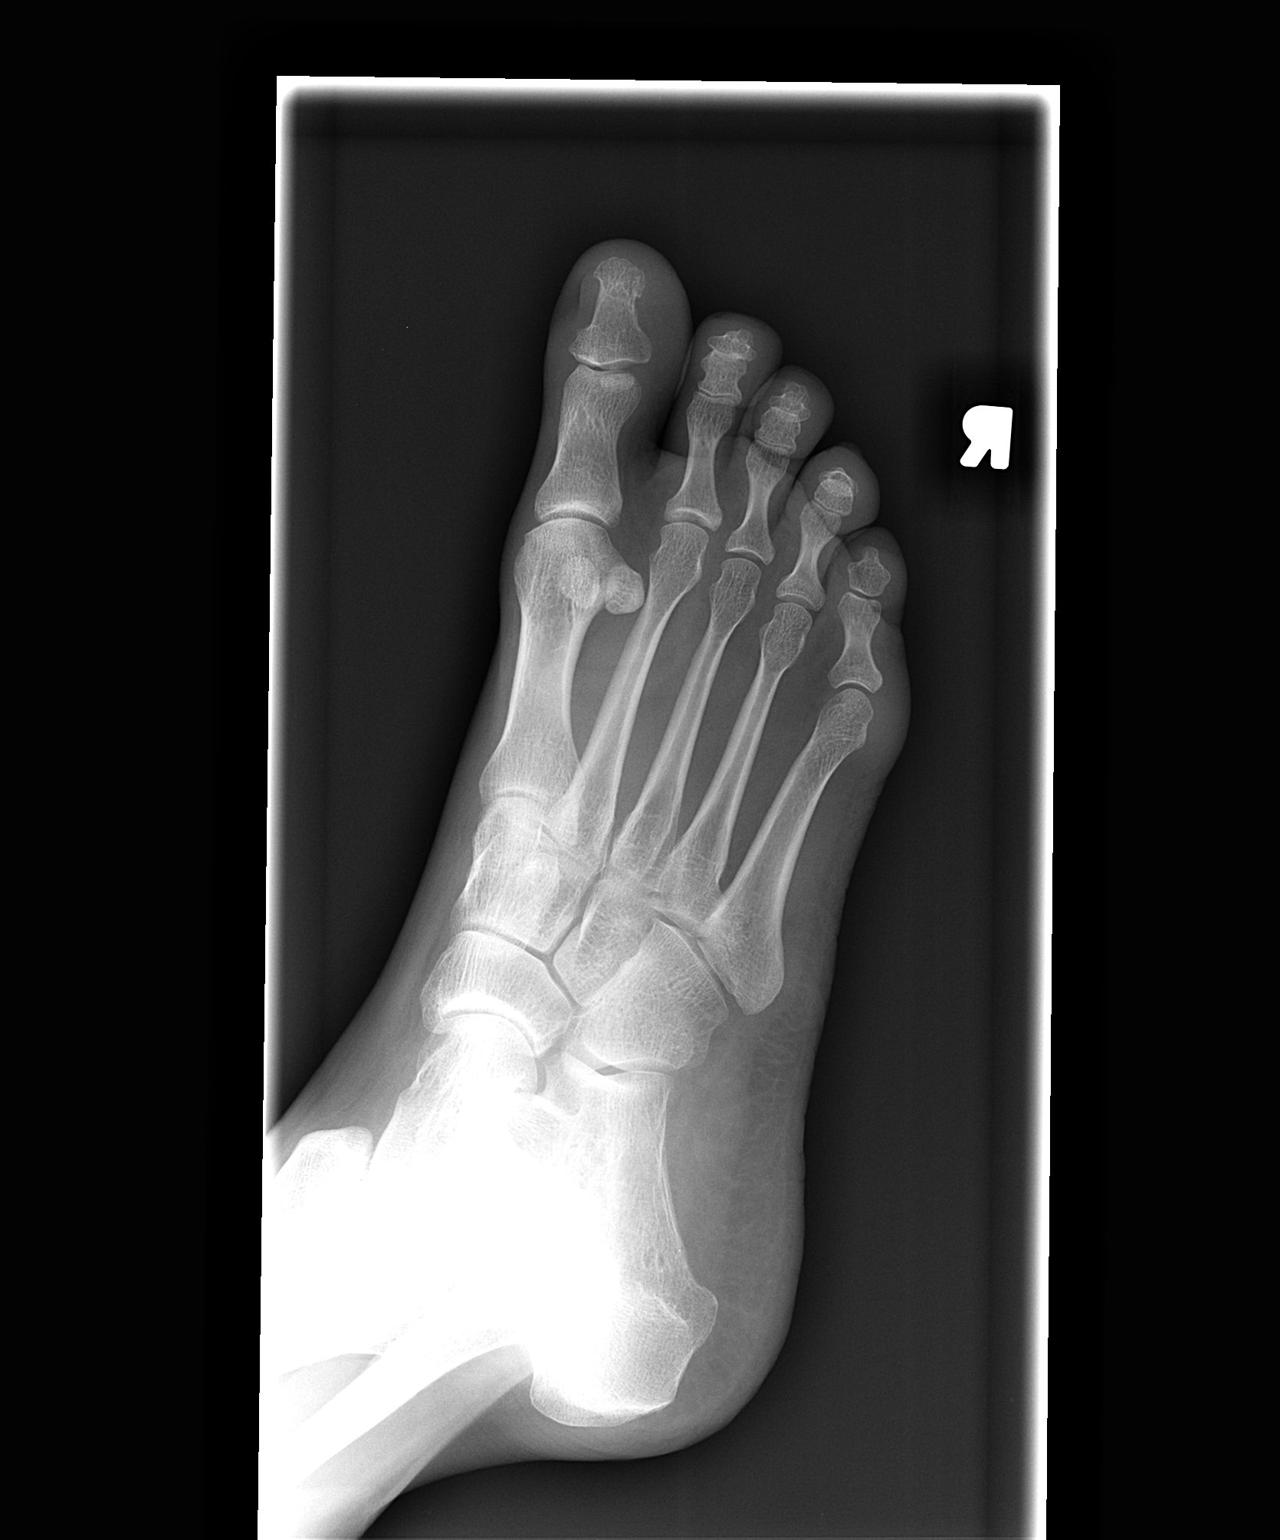

[view not recorded (3 of 3)]
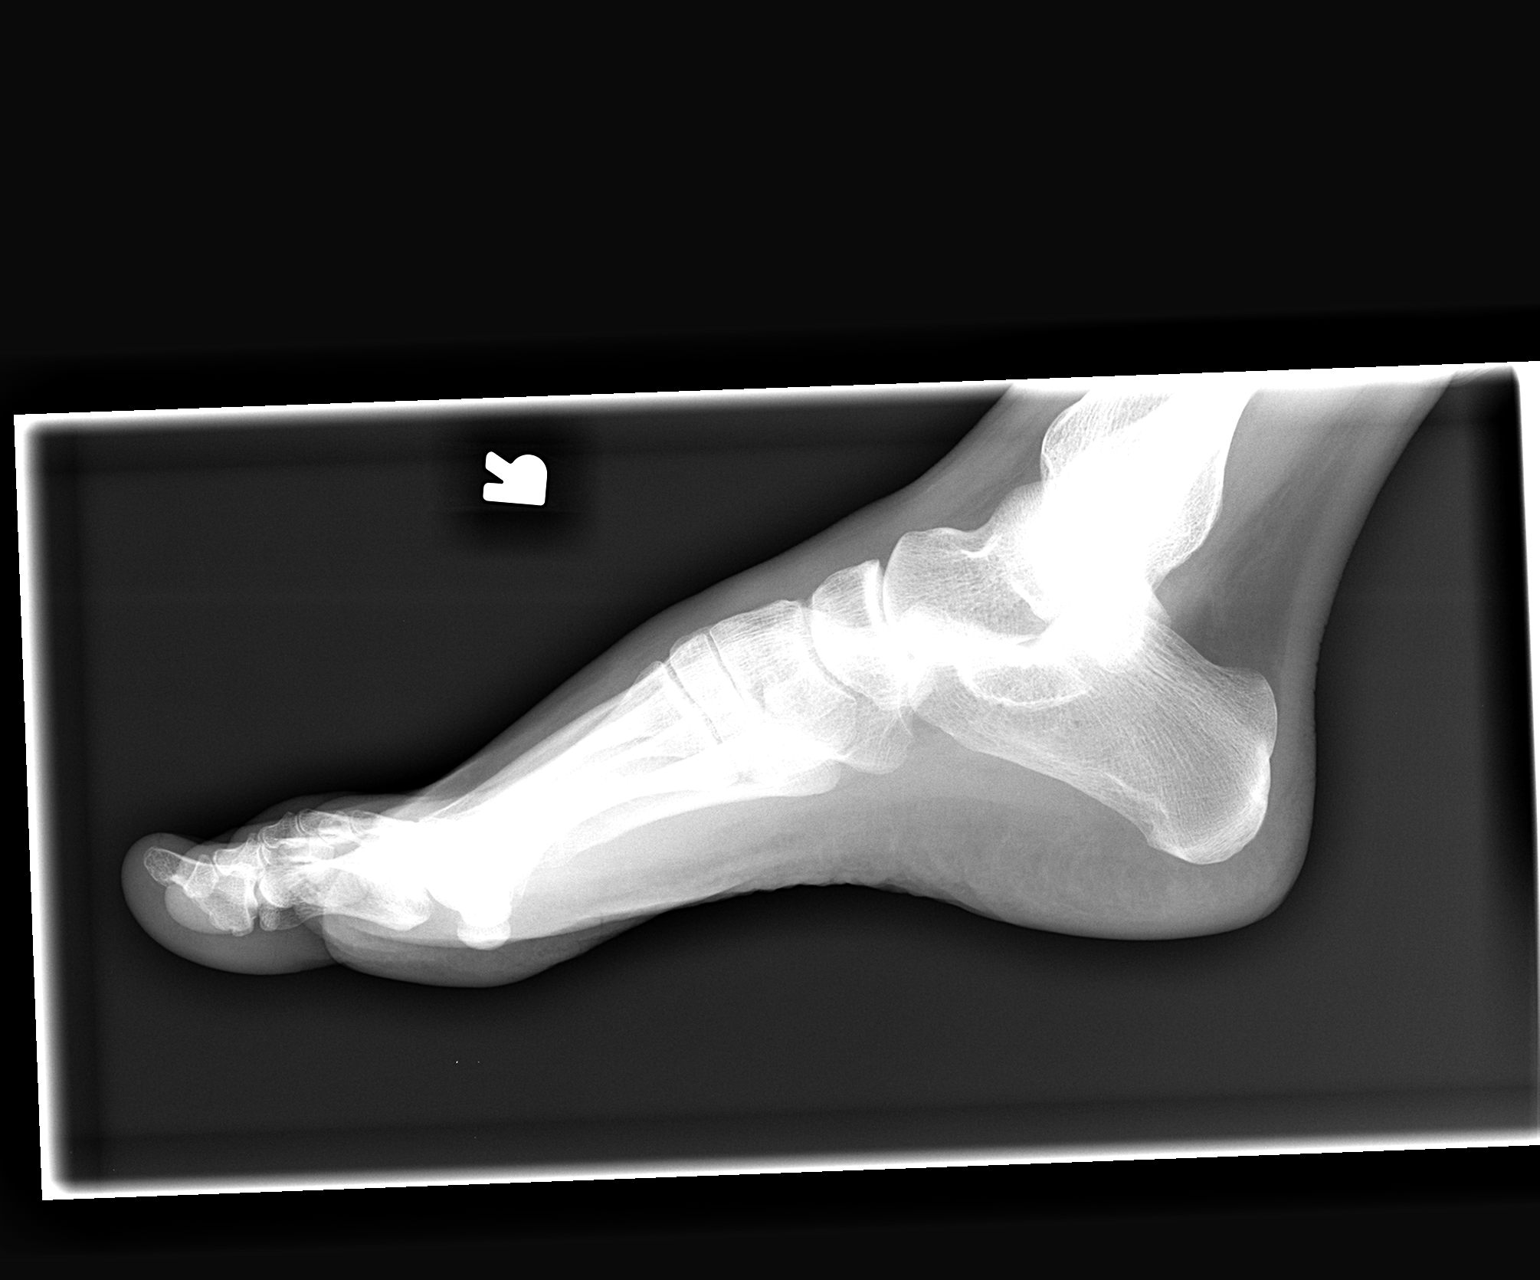

[3 of 3 positions shown; findings below may reference images not displayed]

FINDINGS: No evidence of fracture or dislocation of the midfoot or
forefoot.  Phalanges normal.  No soft tissue abnormality.
IMPRESSION: No evidence of fracture.

## 2013-12-25 ENCOUNTER — Encounter (HOSPITAL_COMMUNITY): Payer: Self-pay | Admitting: Emergency Medicine

## 2014-01-04 ENCOUNTER — Encounter: Payer: Self-pay | Admitting: Family Medicine

## 2014-01-04 ENCOUNTER — Ambulatory Visit (INDEPENDENT_AMBULATORY_CARE_PROVIDER_SITE_OTHER): Payer: BC Managed Care – PPO | Admitting: Family Medicine

## 2014-01-04 VITALS — BP 106/86 | HR 64 | Temp 98.0°F | Ht 63.0 in | Wt 96.2 lb

## 2014-01-04 DIAGNOSIS — F418 Other specified anxiety disorders: Secondary | ICD-10-CM

## 2014-01-04 DIAGNOSIS — F1110 Opioid abuse, uncomplicated: Secondary | ICD-10-CM

## 2014-01-04 MED ORDER — PAROXETINE HCL 20 MG PO TABS: 20.0000 mg | ORAL_TABLET | Freq: Every day | ORAL | Status: DC

## 2014-01-04 MED ORDER — CLONAZEPAM 0.5 MG PO TABS: 0.5000 mg | ORAL_TABLET | Freq: Two times a day (BID) | ORAL | Status: DC | PRN

## 2014-01-04 NOTE — Patient Instructions (Signed)
Generalized Anxiety Disorder Generalized anxiety disorder (GAD) is a mental disorder. It interferes with life functions, including relationships, work, and school. GAD is different from normal anxiety, which everyone experiences at some point in their lives in response to specific life events and activities. Normal anxiety actually helps us prepare for and get through these life events and activities. Normal anxiety goes away after the event or activity is over.  GAD causes anxiety that is not necessarily related to specific events or activities. It also causes excess anxiety in proportion to specific events or activities. The anxiety associated with GAD is also difficult to control. GAD can vary from mild to severe. People with severe GAD can have intense waves of anxiety with physical symptoms (panic attacks).  SYMPTOMS The anxiety and worry associated with GAD are difficult to control. This anxiety and worry are related to many life events and activities and also occur more days than not for 6 months or longer. People with GAD also have three or more of the following symptoms (one or more in children):  Restlessness.   Fatigue.  Difficulty concentrating.   Irritability.  Muscle tension.  Difficulty sleeping or unsatisfying sleep. DIAGNOSIS GAD is diagnosed through an assessment by your health care provider. Your health care provider will ask you questions aboutyour mood,physical symptoms, and events in your life. Your health care provider may ask you about your medical history and use of alcohol or drugs, including prescription medicines. Your health care provider may also do a physical exam and blood tests. Certain medical conditions and the use of certain substances can cause symptoms similar to those associated with GAD. Your health care provider may refer you to a mental health specialist for further evaluation. TREATMENT The following therapies are usually used to treat GAD:    Medication. Antidepressant medication usually is prescribed for long-term daily control. Antianxiety medicines may be added in severe cases, especially when panic attacks occur.   Talk therapy (psychotherapy). Certain types of talk therapy can be helpful in treating GAD by providing support, education, and guidance. A form of talk therapy called cognitive behavioral therapy can teach you healthy ways to think about and react to daily life events and activities.  Stress managementtechniques. These include yoga, meditation, and exercise and can be very helpful when they are practiced regularly. A mental health specialist can help determine which treatment is best for you. Some people see improvement with one therapy. However, other people require a combination of therapies. Document Released: 06/06/2012 Document Revised: 06/26/2013 Document Reviewed: 06/06/2012 [facility] Patient Information 2015 [place], [place]. This information is not intended to replace advice given to you by your health care provider. Make sure you discuss any questions you have with your health care provider.  I will need to see you back in 3-4 weeks to check up on your anxiety with depression. Take the Klonopin only as needed for anxiety, or at nighttime. Please make an appointment for as soon as possible to remove the IUD and discuss other options.

## 2014-01-04 NOTE — Progress Notes (Signed)
   Subjective:    Patient ID: Evelyn QuantMarsha M Mccullough, female    DOB: 01-26-1981, 33 y.o.   MRN: [id number]  HPI Patient presents to the [facility] today for reestablishment of care. She has not been seen here for 2 and half years.  Depression with anxiety: Patient states that she suffers from anxiety and panic attacks. She has been evaluated by psychiatry, and multiple other providers and has been treated in the past with Lexapro and/or Xanax. She has not been treated for this order for some time. Patient states that she is not able to stop or control worrying nearly every day. Over the half a day she feels nervous, anxious or on edge. Worry too much about different things. Trouble relaxing. Being psoriasis it's hard to sit still. Becoming easily annoyed or irritable. On several day she feels afraid as if something awful might happen. Nearly everyday she has trouble falling asleep or staying asleep too much. When than half the day she feels little interest or pleasure in doing things feeling down depressed or hopeless and feeling tired or having little energy.  Several days she feels down, depressed or hopeless. She has a poor appetite. She feels bad about herself and that she is a failure down to her family. She has trouble concentrating on things. She moves or speaks so slowly that other people have noticed. She finds it very difficult to do her work and take care of things at home with the above symptoms. There is a family history of depression and anxiety. There are no mood disorder histories.  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 She has been on Suboxone since January 2015. This has been prescribed to her through her psychiatrist in the area. She states she only has one Suboxone pill left, and she is worried that she is going to go through withdrawal because her psychiatrist will longer see her. She states that she  missed an appointment, and the office will not approve another appointment. Her ultimate goal is to be tapered off of Suboxone as well.  Every day smoker  Past Medical History  Diagnosis Date  . Anxiety   . Depression   . GERD (gastroesophageal reflux disease)   . Substance abuse   . Restless leg syndrome     No Known Allergies  Review of Systems Per history of present illness    Objective:   Physical Exam BP 106/86 mmHg  Pulse 64  Temp(Src) 98 F (36.7 C) (Oral)  Ht 5\' 3"  (1.6 m)  Wt 96 lb 3.2 oz (43.636 kg)  BMI 17.05 kg/m2 Gen: pleasant, thin Caucasian female in no acute distress, nontoxic in appearance. HEENT: AT. [place]. [place]. EOMI.Bilateral TM visualized and normal in appearance. Bilateral eyes without injections or icterus. MMM. Bilateral nares without erythema or swelling. Throat without erythema or exudates. Multiple acne facial scars. Very poor dentition/ adentulous  CV: RRR Chest: CTAB, no wheeze or crackles Abd: Soft. flat. NTND. BS present. no Masses palpated.  Ext: No erythema. No edema. +2/4 PT/DP Skin:no rashes, purpura or petechiae.  Neuro:Normal gait. PERLA. EOMi. Alert. Cranial nerves II through XII intact. DTRs equal bilaterally. Psych: appears mildly anxious, normal speech. No psychomotor agitation noted. No SI/HI     Assessment & Plan:   Greater than 25 minutes was spent face-to-face counseling patient

## 2014-01-05 ENCOUNTER — Encounter: Payer: Self-pay | Admitting: Family Medicine

## 2014-01-05 DIAGNOSIS — F1110 Opioid abuse, uncomplicated: Secondary | ICD-10-CM | POA: Insufficient documentation

## 2014-01-05 NOTE — Assessment & Plan Note (Signed)
Patient has a history of narcotic abuse with Percocets. She is currently using [facility] Due to missing an appointment, she was told that he would not see her again. She has 1 Suboxone October, and she is concerned that she is going to go through withdrawal. She is eventually wanting to be tapered off Suboxone altogether. Red flags discussed with patient, and she is advised to go to the emergency room if she indeed starts to have symptoms of withdrawal. [facility] contact information was given to patient, and she was encouraged to call them today to aid in Suboxone treatment. Follow-up as needed

## 2014-01-05 NOTE — Assessment & Plan Note (Signed)
Patient with prior history of depression and anxiety, appears to have been treated on Lexapro and Xanax at different times in the past. PH Q score of 14 today, GAD score of 14 today Discussed in length with patient today the treatments available for depression and anxiety. It does not appear that she has a mood disorder. After discussion we decided on Paxil for long-term use, and Klonopin for short-term use with the idea to hopefully taper off Klonopin use after Paxil is established in her system. Patient is agreeable with plan today. She will follow-up in 3-4 weeks, at that time we will adjust medications if necessary.

## 2014-01-11 ENCOUNTER — Ambulatory Visit: Payer: BC Managed Care – PPO | Admitting: Family Medicine

## 2014-01-11 ENCOUNTER — Telehealth: Payer: Self-pay | Admitting: *Deleted

## 2014-01-11 NOTE — Telephone Encounter (Signed)
Pt called stating every since she started back on the Paxil she has been very tired.  She has to take several breaks throughout the day.  Pt is wondering if there is a different medication the provider can prescribe.  Will forward to PCP for further advise.  Clovis PuMartin, Gracemarie Skeet L, RN

## 2014-01-12 ENCOUNTER — Encounter (HOSPITAL_COMMUNITY): Payer: Self-pay | Admitting: Emergency Medicine

## 2014-01-12 ENCOUNTER — Emergency Department (HOSPITAL_COMMUNITY)
Admission: EM | Admit: 2014-01-12 | Discharge: 2014-01-12 | Disposition: A | Discharge status: Home / Self Care | Payer: BC Managed Care – PPO | Attending: Emergency Medicine | Admitting: Emergency Medicine

## 2014-01-12 ENCOUNTER — Telehealth: Payer: Self-pay | Admitting: Family Medicine

## 2014-01-12 DIAGNOSIS — K5289 Other specified noninfective gastroenteritis and colitis: Secondary | ICD-10-CM | POA: Diagnosis not present

## 2014-01-12 DIAGNOSIS — Z8719 Personal history of other diseases of the digestive system: Secondary | ICD-10-CM | POA: Diagnosis not present

## 2014-01-12 DIAGNOSIS — F329 Major depressive disorder, single episode, unspecified: Secondary | ICD-10-CM | POA: Insufficient documentation

## 2014-01-12 DIAGNOSIS — R1110 Vomiting, unspecified: Secondary | ICD-10-CM | POA: Diagnosis present

## 2014-01-12 DIAGNOSIS — Z720 Tobacco use: Secondary | ICD-10-CM | POA: Insufficient documentation

## 2014-01-12 DIAGNOSIS — Z79899 Other long term (current) drug therapy: Secondary | ICD-10-CM | POA: Insufficient documentation

## 2014-01-12 DIAGNOSIS — F419 Anxiety disorder, unspecified: Secondary | ICD-10-CM | POA: Diagnosis not present

## 2014-01-12 DIAGNOSIS — Z3202 Encounter for pregnancy test, result negative: Secondary | ICD-10-CM | POA: Diagnosis not present

## 2014-01-12 DIAGNOSIS — K529 Noninfective gastroenteritis and colitis, unspecified: Secondary | ICD-10-CM

## 2014-01-12 LAB — I-STAT CHEM 8, ED
BUN: 8 mg/dL (ref 6–23)
CALCIUM ION: 1.17 mmol/L (ref 1.12–1.23)
CHLORIDE: 104 meq/L (ref 96–112)
CREATININE: 0.9 mg/dL (ref 0.50–1.10)
GLUCOSE: 155 mg/dL — AB (ref 70–99)
HEMATOCRIT: 49 % — AB (ref 36.0–46.0)
Hemoglobin: 16.7 g/dL — ABNORMAL HIGH (ref 12.0–15.0)
Potassium: 3.9 mEq/L (ref 3.7–5.3)
Sodium: 138 mEq/L (ref 137–147)
TCO2: 23 mmol/L (ref 0–100)

## 2014-01-12 LAB — URINALYSIS, ROUTINE W REFLEX MICROSCOPIC
GLUCOSE, UA: 100 mg/dL — AB
Ketones, ur: >80 mg/dL — AB
LEUKOCYTES UA: NEGATIVE
Nitrite: NEGATIVE
Protein, ur: 30 mg/dL — AB
Specific Gravity, Urine: >1.03 — ABNORMAL HIGH (ref 1.005–1.030)
UROBILINOGEN UA: 1 mg/dL (ref 0.0–1.0)
pH: 6 (ref 5.0–8.0)

## 2014-01-12 LAB — COMPREHENSIVE METABOLIC PANEL
ALBUMIN: 4.7 g/dL (ref 3.5–5.2)
ALT: 7 U/L (ref 0–35)
AST: 15 U/L (ref 0–37)
Alkaline Phosphatase: 62 U/L (ref 39–117)
Anion gap: 15 (ref 5–15)
BUN: 11 mg/dL (ref 6–23)
CALCIUM: 10 mg/dL (ref 8.4–10.5)
CO2: 22 mEq/L (ref 19–32)
CREATININE: 0.8 mg/dL (ref 0.50–1.10)
Chloride: 101 mEq/L (ref 96–112)
GFR calc Af Amer: >90 mL/min (ref >90)
Glucose, Bld: 148 mg/dL — ABNORMAL HIGH (ref 70–99)
Potassium: 3.8 mEq/L (ref 3.7–5.3)
Sodium: 138 mEq/L (ref 137–147)
Total Bilirubin: 0.6 mg/dL (ref 0.3–1.2)
Total Protein: 7.6 g/dL (ref 6.0–8.3)

## 2014-01-12 LAB — CBC WITH DIFFERENTIAL/PLATELET
Basophils Absolute: 0 10*3/uL (ref 0.0–0.1)
Basophils Relative: 0 % (ref 0–1)
EOS ABS: 0 10*3/uL (ref 0.0–0.7)
Eosinophils Relative: 0 % (ref 0–5)
HEMATOCRIT: 46.8 % — AB (ref 36.0–46.0)
HEMOGLOBIN: 16.4 g/dL — AB (ref 12.0–15.0)
LYMPHS PCT: 6 % — AB (ref 12–46)
Lymphs Abs: 0.6 10*3/uL — ABNORMAL LOW (ref 0.7–4.0)
MCH: 29.5 pg (ref 26.0–34.0)
MCHC: 35 g/dL (ref 30.0–36.0)
MCV: 84.3 fL (ref 78.0–100.0)
MONO ABS: 0.3 10*3/uL (ref 0.1–1.0)
MONOS PCT: 3 % (ref 3–12)
Neutro Abs: 8.6 10*3/uL — ABNORMAL HIGH (ref 1.7–7.7)
Neutrophils Relative %: 91 % — ABNORMAL HIGH (ref 43–77)
PLATELETS: 191 10*3/uL (ref 150–400)
RBC: 5.55 MIL/uL — AB (ref 3.87–5.11)
RDW: 12.4 % (ref 11.5–15.5)
WBC: 9.5 10*3/uL (ref 4.0–10.5)

## 2014-01-12 LAB — URINE MICROSCOPIC-ADD ON

## 2014-01-12 LAB — LIPASE, BLOOD: LIPASE: 15 U/L (ref 11–59)

## 2014-01-12 LAB — PREGNANCY, URINE: PREG TEST UR: NEGATIVE

## 2014-01-12 MED ORDER — KETOROLAC TROMETHAMINE 30 MG/ML IJ SOLN
30.0000 mg | Freq: Once | INTRAMUSCULAR | Status: AC
  Administered 2014-01-12: 30 mg via INTRAVENOUS
  Filled 2014-01-12: qty 1

## 2014-01-12 MED ORDER — SODIUM CHLORIDE 0.9 % IV BOLUS (SEPSIS)
1000.0000 mL | Freq: Once | INTRAVENOUS | Status: AC
  Administered 2014-01-12: 1000 mL via INTRAVENOUS

## 2014-01-12 MED ORDER — PROMETHAZINE HCL 25 MG PO TABS: 25.0000 mg | ORAL_TABLET | Freq: Four times a day (QID) | ORAL | Status: DC | PRN

## 2014-01-12 MED ORDER — PROMETHAZINE HCL 25 MG/ML IJ SOLN
12.5000 mg | Freq: Once | INTRAMUSCULAR | Status: AC
  Administered 2014-01-12: 12.5 mg via INTRAVENOUS
  Filled 2014-01-12: qty 1

## 2014-01-12 MED ORDER — ONDANSETRON HCL 4 MG/2ML IJ SOLN
4.0000 mg | Freq: Once | INTRAMUSCULAR | Status: AC
  Administered 2014-01-12: 4 mg via INTRAVENOUS
  Filled 2014-01-12: qty 2

## 2014-01-12 NOTE — ED Notes (Signed)
Pt c/o of N/V/D since this morning. Pt reports being unable to keep any food or liquids down without emesis or diarrhea. Pt unsure of how many episodes of V/D. Pt A&O.

## 2014-01-12 NOTE — ED Provider Notes (Signed)
CSN: [id number]     Arrival date & time 01/12/14  1152 History  This chart was scribe for Geoffery Lyonsouglas Eddy Liszewski, MD by Angelene GiovanniEmmanuella Mensah, ED Scribe. The patient was seen in room APA15/APA15 and the patient's care was started at 1:29 PM.    Chief Complaint  Patient presents with  . Emesis   Patient is a 33 y.o. female presenting with vomiting. The history is provided by the patient. No language interpreter was used.  Emesis Duration:  1 month Timing:  Constant Able to tolerate:  Liquids and solids Progression:  Worsening Worsened by:  Nothing tried Ineffective treatments:  None tried Associated symptoms: abdominal pain    HPI Comments: Beryle QuantMarsha M Hiltner is a 33 y.o. female who presents to the Emergency Department complaining of vomiting and constant abdominal pain and cramps onset 1 month ago. She reports associated dehydration. She explains that the pain became worse this morning. She denies sick contacts at home.   Past Medical History  Diagnosis Date  . Anxiety   . Depression   . GERD (gastroesophageal reflux disease)   . Substance abuse   . Restless leg syndrome    History reviewed. No pertinent past surgical history. Family History  Problem Relation Age of Onset  . Hypertension Father   . Hyperlipidemia Father   . Stroke Father   . Stroke Paternal Grandmother   . Breast cancer Paternal Grandmother    History  Substance Use Topics  . Smoking status: Current Every Day Smoker -- 0.50 packs/day for 10 years    Types: Cigarettes  . Smokeless tobacco: Never Used     Comment: wants to quitt with husband  . Alcohol Use: No   OB History    Gravida Para Term Preterm AB TAB SAB Ectopic Multiple Living   3 3 3       3      Review of Systems  Gastrointestinal: Positive for vomiting and abdominal pain.  All other systems reviewed and are negative.     Allergies  Review of patient's allergies indicates no known allergies.  Home Medications   Prior to Admission medications    Medication Sig Start Date End Date Taking? Authorizing Provider  clonazePAM (KLONOPIN) 0.5 MG tablet Take 1 tablet (0.5 mg total) by mouth 2 (two) times daily as needed for anxiety. 01/04/14  Yes Renee A Kuneff, DO  PARoxetine (PAXIL) 20 MG tablet Take 1 tablet (20 mg total) by mouth daily. 01/04/14  Yes Renee A Kuneff, DO  promethazine (PHENERGAN) 25 MG suppository Place 1 suppository (25 mg total) rectally every 6 (six) hours as needed for nausea or vomiting. Patient not taking: Reported on 01/12/2014 03/09/13   Joya Gaskinsonald W Wickline, MD  promethazine (PHENERGAN) 25 MG tablet Take 1 tablet (25 mg total) by mouth every 6 (six) hours as needed for nausea or vomiting. Patient not taking: Reported on 01/12/2014 03/09/13   Joya Gaskinsonald W Wickline, MD   BP 108/71 mmHg  Pulse 64  Temp(Src) 97.6 F (36.4 C) (Oral)  Resp 18  Ht 5\' 3"  (1.6 m)  Wt 100 lb (45.36 kg)  BMI 17.72 kg/m2  SpO2 100% Physical Exam  Constitutional: She is oriented to person, place, and time. She appears well-developed and well-nourished.  HENT:  Head: Normocephalic and atraumatic.  Mouth/Throat: Oropharynx is clear and moist.  Eyes: EOM are normal. Pupils are equal, round, and reactive to light.  Cardiovascular: Normal rate and regular rhythm.   No murmur heard. Pulmonary/Chest: Effort normal and breath sounds normal.  No respiratory distress.  Abdominal: Soft. Bowel sounds are normal. She exhibits no distension. There is tenderness. There is no rebound and no guarding.  There is mild TTP in all 4 Q  Musculoskeletal: Normal range of motion. She exhibits no edema.  Neurological: She is alert and oriented to person, place, and time.  Skin: Skin is warm and dry.  Psychiatric: She has a normal mood and affect.  Nursing note and vitals reviewed.   ED Course  Procedures (including critical care time) DIAGNOSTIC STUDIES: Oxygen Saturation is 100% on RA, normal by my interpretation.    COORDINATION OF CARE: 1:32 PM- Pt advised  of plan for treatment and pt agrees.    Labs Review Labs Reviewed  URINALYSIS, ROUTINE W REFLEX MICROSCOPIC - Abnormal; Notable for the following:    Specific Gravity, Urine >1.030 (*)    Glucose, UA 100 (*)    Hgb urine dipstick MODERATE (*)    Bilirubin Urine MODERATE (*)    Ketones, ur >80 (*)    Protein, ur 30 (*)    All other components within normal limits  COMPREHENSIVE METABOLIC PANEL - Abnormal; Notable for the following:    Glucose, Bld 148 (*)    All other components within normal limits  CBC WITH DIFFERENTIAL - Abnormal; Notable for the following:    RBC 5.55 (*)    Hemoglobin 16.4 (*)    HCT 46.8 (*)    Neutrophils Relative % 91 (*)    Neutro Abs 8.6 (*)    Lymphocytes Relative 6 (*)    Lymphs Abs 0.6 (*)    All other components within normal limits  URINE MICROSCOPIC-ADD ON - Abnormal; Notable for the following:    Squamous Epithelial / LPF MANY (*)    Bacteria, UA FEW (*)    All other components within normal limits  I-STAT CHEM 8, ED - Abnormal; Notable for the following:    Glucose, Bld 155 (*)    Hemoglobin 16.7 (*)    HCT 49.0 (*)    All other components within normal limits  PREGNANCY, URINE  LIPASE, BLOOD    Imaging Review No results found.   EKG Interpretation None      MDM   Final diagnoses:  None    Patient presents with complaints of abdominal cramping, nausea, vomiting, and diarrhea that started yesterday. Her presentation, exam, and workup is consistent with a viral gastroenteritis. She was given IV fluids and both Zofran and Phenergan and is feeling better. I doubt an emergent situation and I feel as though she is appropriate for discharge. She will be discharged with Phenergan and when necessary return.  I personally performed the services described in this documentation, which was scribed in my presence. The recorded information has been reviewed and is accurate.    Geoffery Lyonsouglas Kaedence Connelly, MD 01/12/14 1520

## 2014-01-12 NOTE — ED Notes (Signed)
This nurse asked pt if it was possible that she was in dt's. Pt does not have symptoms but has a hx of substance abuse. Pt did vomit (1 episode) green/yellow bile. Pt denies and states that she was weaned off of suboxone and that her last dose was two months ago. Pt states that her symptoms do not feel like withdrawals but that she feels she has a stomach virus and needs fluids. This nurse told pt that we would wait on lab results and for the EDP to advise. Pt verbalized understanding.

## 2014-01-12 NOTE — Discharge Instructions (Signed)
Zofran as prescribed as needed for nausea.  Follow-up with your primary Dr. if not improving in the next several days, and return to the ER if your symptoms substantially worsen or change.   Viral Gastroenteritis Viral gastroenteritis is also known as stomach flu. This condition affects the stomach and intestinal tract. It can cause sudden diarrhea and vomiting. The illness typically lasts 3 to 8 days. Most people develop an immune response that eventually gets rid of the virus. While this natural response develops, the virus can make you quite ill. CAUSES  Many different viruses can cause gastroenteritis, such as rotavirus or noroviruses. You can catch one of these viruses by consuming contaminated food or water. You may also catch a virus by sharing utensils or other personal items with an infected person or by touching a contaminated surface. SYMPTOMS  The most common symptoms are diarrhea and vomiting. These problems can cause a severe loss of body fluids (dehydration) and a body salt (electrolyte) imbalance. Other symptoms may include:  Fever.  Headache.  Fatigue.  Abdominal pain. DIAGNOSIS  Your caregiver can usually diagnose viral gastroenteritis based on your symptoms and a physical exam. A stool sample may also be taken to test for the presence of viruses or other infections. TREATMENT  This illness typically goes away on its own. Treatments are aimed at rehydration. The most serious cases of viral gastroenteritis involve vomiting so severely that you are not able to keep fluids down. In these cases, fluids must be given through an intravenous line (IV). HOME CARE INSTRUCTIONS   Drink enough fluids to keep your urine clear or pale yellow. Drink small amounts of fluids frequently and increase the amounts as tolerated.  Ask your caregiver for specific rehydration instructions.  Avoid:  Foods high in sugar.  Alcohol.  Carbonated drinks.  Tobacco.  Juice.  Caffeine  drinks.  Extremely hot or cold fluids.  Fatty, greasy foods.  Too much intake of anything at one time.  Dairy products until 24 to 48 hours after diarrhea stops.  You may consume probiotics. Probiotics are active cultures of beneficial bacteria. They may lessen the amount and number of diarrheal stools in adults. Probiotics can be found in yogurt with active cultures and in supplements.  Wash your hands well to avoid spreading the virus.  Only take over-the-counter or prescription medicines for pain, discomfort, or fever as directed by your caregiver. Do not give aspirin to children. Antidiarrheal medicines are not recommended.  Ask your caregiver if you should continue to take your regular prescribed and over-the-counter medicines.  Keep all follow-up appointments as directed by your caregiver. SEEK IMMEDIATE MEDICAL CARE IF:   You are unable to keep fluids down.  You do not urinate at least once every 6 to 8 hours.  You develop shortness of breath.  You notice blood in your stool or vomit. This may look like coffee grounds.  You have abdominal pain that increases or is concentrated in one small area (localized).  You have persistent vomiting or diarrhea.  You have a fever.  The patient is a child younger than 3 months, and he or she has a fever.  The patient is a child older than 3 months, and he or she has a fever and persistent symptoms.  The patient is a child older than 3 months, and he or she has a fever and symptoms suddenly get worse.  The patient is a baby, and he or she has no tears when crying. MAKE SURE  YOU:   Understand these instructions.  Will watch your condition.  Will get help right away if you are not doing well or get worse. Document Released: 02/09/2005 Document Revised: 05/04/2011 Document Reviewed: 11/26/2010 [facility] Patient Information 2015 [place], [place]. This information is not intended to replace advice given to you by your health care  provider. Make sure you discuss any questions you have with your health care provider.

## 2014-01-12 NOTE — ED Notes (Signed)
The i-stat resulted on this patient not the information of this patient. Corrective action is being taken to remove that information.

## 2014-01-12 NOTE — Telephone Encounter (Signed)
I would advise pt stay on the Paxil at this time. Once the drug has the ability to level out in her system she will likely not experience the sleepiness. In addition it is more likely the sleepiness is coming from the klonopin rather than the Paxil. Thanks.

## 2014-01-25 ENCOUNTER — Ambulatory Visit: Payer: BC Managed Care – PPO | Admitting: Family Medicine

## 2014-02-01 ENCOUNTER — Ambulatory Visit: Payer: BC Managed Care – PPO

## 2014-06-07 ENCOUNTER — Telehealth: Payer: Self-pay | Admitting: Family Medicine

## 2014-06-07 ENCOUNTER — Ambulatory Visit (INDEPENDENT_AMBULATORY_CARE_PROVIDER_SITE_OTHER): Payer: Medicaid Other | Admitting: Family Medicine

## 2014-06-07 ENCOUNTER — Encounter: Payer: Self-pay | Admitting: Family Medicine

## 2014-06-07 VITALS — BP 103/68 | HR 75 | Temp 98.1°F | Ht 63.0 in | Wt 101.8 lb

## 2014-06-07 DIAGNOSIS — G5631 Lesion of radial nerve, right upper limb: Secondary | ICD-10-CM

## 2014-06-07 DIAGNOSIS — F1110 Opioid abuse, uncomplicated: Secondary | ICD-10-CM | POA: Diagnosis not present

## 2014-06-07 DIAGNOSIS — F418 Other specified anxiety disorders: Secondary | ICD-10-CM

## 2014-06-07 MED ORDER — CLONAZEPAM 0.5 MG PO TABS
0.5000 mg | ORAL_TABLET | Freq: Two times a day (BID) | ORAL | Status: DC | PRN
Start: 2014-06-07 — End: 2014-07-26

## 2014-06-07 MED ORDER — PAROXETINE HCL 20 MG PO TABS: 20.0000 mg | ORAL_TABLET | Freq: Every day | ORAL | Status: DC

## 2014-06-07 NOTE — Progress Notes (Signed)
   Subjective:    Patient ID: Evelyn QuantMarsha M Giovanetti, female    DOB: [birth year], 34 y.o.   MRN: [id number]  HPI  Right hand numbness: A she presents with numbness in her right hand, over her dorsal thumb index and third finger. She is having some numbness in her fingertips as well. Patient states she woke up on Friday morning with both of her hands feeling numb. The left hand came back immediately upon waking, but the right hand did not come back. She became concerned when she started dropping objects when attempting to pick him up with her right hand. There is no pain associated with this condition. He denies any trauma to her neck or arm, in the past or currently. She states that she is unable to take extend her hand. She was seen in urgent care and refill on Friday for this, and they gave her a wrist splint to wear at night, and a steroid burst. She has one day left in her steroid burst, but has seen no improvement in her symptoms. Symptoms have not become worse.  Depression with anxiety: Patient states she initially started her Paxil and Klonopin, but could not continue due to loss of insurance. She now has Medicaid again and will like to restart her Paxil and Klonopin. She states that she felt better when she was on these medications.  Narcotic addiction: She has a history of narcotic condition, she has been on Suboxone in the past. She is interested in finding a place to help her with the Suboxone. She has a doctor in mind, that she knows prescribe Suboxone is requesting referral today for a Dr. Gerilyn Pilgrimoonquah.   Current every day smoker  Past Medical History  Diagnosis Date  . Anxiety   . Depression   . GERD (gastroesophageal reflux disease)   . Substance abuse   . Restless leg syndrome    No Known Allergies   Review of Systems Per history of present illness    Objective:   Physical Exam BP 103/68 mmHg  Pulse 75  Temp(Src) 98.1 F (36.7 C) (Oral)  Ht 5\' 3"  (1.6 m)  Wt 101 lb 12.8 oz  (46.176 kg)  BMI 18.04 kg/m2 Gen: NAD. Nontoxic appearance, thin, pleasant Caucasian female. Ext: No erythema. No edema. No bruising. No swelling. No tenderness to palpation. Decreased sensation dorsal thumb, index and third finger. Fingertips of thumb, index and third finger are also numb. Normal range of motion, with the exception of inability to extend hand. Extremely weak finger squeeze test. Normal muscle strength bicep tricep bilaterally. Negative Tinel's at wrist and elbow. Possible positive Phalen's. Vascular intact.     Assessment & Plan:

## 2014-06-07 NOTE — Assessment & Plan Note (Signed)
Dr. that she is requesting is a neurologist. I will have the office call and contact his office to see if he performs addiction medicine, and prescribed Suboxone prior to make an referral.

## 2014-06-07 NOTE — Telephone Encounter (Signed)
Patient is requesting referral for addiction medicine, she has had narcotic abuse in the past. She has been on Suboxone, but kicked out a clinic for no-shows. Patient is asking for specific doctor, that she has found out prescribes Suboxone. Upon researching this doctor, it appears he is a neurologist, and I'm uncertain if he actually does addiction medicine or describe Suboxone for narcotic abuse. I need our office to contact his office, to see if this is something he does prior to placing a referral to his office. Doctor's information is provided below, please contact the office, if he does prescribe this medication and performs addiction medicine I will place a referral for her to go to him. Thanks.   Dr. Gerilyn Pil[place]- [place],  [phone]

## 2014-06-07 NOTE — Assessment & Plan Note (Signed)
Restart Paxil and Klonopin, prescriptions given for both today.

## 2014-06-07 NOTE — Assessment & Plan Note (Signed)
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 Patient completely unable to extend hand, with numbness/tingling in the radial nerve distribution. Eyes patient to continue her steroid burst, and she can continue to wear her splint for comfort. Urgent referral to neurology, for evaluation and nerve conduction studies. Follow-up as needed

## 2014-06-07 NOTE — Patient Instructions (Signed)
Radial Nerve Palsy Wrist drop is also known as radial nerve palsy. It is a condition in which you can not extend your wrist. This means if you are standing with your elbow bent at a right angle and with the top of your hand pointed at the ceiling, you can not hold your hand up. It falls toward the floor.  This action of extending your wrist is caused by the muscles in the back of your arm. These muscles are controlled by the radial nerve. This means that anything affecting the radial nerve so it can not tell the muscles how to work will cause wrist drop. This is medically called radial nerve palsy. Also the radial nerve is a motor and sensory nerve so anything affecting it causes problems with movement and feeling. CAUSES  Some more common causes of wrist drop are:  A break (fracture) of the large bone in the arm between your shoulder and your elbow (humerus). This is because the radial nerve winds around the humerus.  Improper use of crutches causes this because the radial nerve runs through the armpit (axilla). Crutches which are too long can put pressure on the nerve. This is sometimes called crutch palsy.  Falling asleep with your arm over a chair and supported on the back is a common cause. This is sometimes called Saturday Night Syndrome.  Wrist drop can be associated with lead poisoning because of the effect of lead on the radial nerve. SYMPTOMS  The wrist drop is an obvious problem, but there may also be numbness in the back of the arm, forearm or hand which provides feeling in these areas by the radial nerve. There can be difficulty straightening out the elbow in addition to the wrist. There may be numbness, tingling, pain, burning sensations or other abnormal feelings. Symptoms depend entirely on where the radial nerve is injured. DIAGNOSIS   Wrist drop is obvious just by looking at it. Your caregiver may make the diagnosis by taking your history and doing a couple tests.  One test which  may be done is a nerve conduction study. This test shows if the radial nerve is conducting signals well. If not, it can determine where the nerve problem is.  Sometimes X-ray studies are done. Your caregiver will determine if further testing needs to be done. TREATMENT   Usually if the problem is found to be pressure on the nerve, simply removing the pressure will allow the nerve to go back to normal in a few weeks to a few months. Other treatments will depend upon the cause found.  Only take over-the-counter or prescription medicines for pain, discomfort, or fever as directed by your caregiver.  Sometimes seizure medications are used.  Steroids are sometimes given to decrease swelling if it is thought to be a possible cause. Document Released: 10/16/2005 Document Revised: 05/04/2011 Document Reviewed: 04/26/2013 [facility] Patient Information 2015 [place]. This information is not intended to replace advice given to you by your health care provider. Make sure you discuss any questions you have with your health care provider.  I would like you to continue taking your prednisone to completion. You can continue to wear your wrist splint, especially at night. I am referring you to neurology to have some nerve conduction studies completed. And they will be able to better guide you on your condition.

## 2014-06-08 NOTE — Telephone Encounter (Signed)
Did contact the office of Dr. Jerre Simonooquah and the receptionist [name] stated that he does prescribe this medicine and also performs addiction medicine.  She stated [place] and [place] that requirement, so the receptionist stated for us to fax the info to them.  Lamonte SakaiZimmerman Rumple, April D

## 2014-06-08 NOTE — Progress Notes (Signed)
I was preceptor the day of this visit.   

## 2014-06-11 ENCOUNTER — Telehealth: Payer: Self-pay | Admitting: Family Medicine

## 2014-06-11 DIAGNOSIS — F1110 Opioid abuse, uncomplicated: Secondary | ICD-10-CM

## 2014-06-11 NOTE — Telephone Encounter (Signed)
Referral placed today for addiction medicine doctor, Dr.Dooquah (he is neurologist that also performs addiction medicine-verified)

## 2014-06-14 ENCOUNTER — Ambulatory Visit: Payer: Medicaid Other

## 2014-06-25 ENCOUNTER — Ambulatory Visit (INDEPENDENT_AMBULATORY_CARE_PROVIDER_SITE_OTHER): Payer: Medicaid Other | Admitting: Neurology

## 2014-06-25 ENCOUNTER — Encounter: Payer: Self-pay | Admitting: Neurology

## 2014-06-25 ENCOUNTER — Ambulatory Visit (INDEPENDENT_AMBULATORY_CARE_PROVIDER_SITE_OTHER): Payer: Self-pay | Admitting: Neurology

## 2014-06-25 DIAGNOSIS — G5631 Lesion of radial nerve, right upper limb: Secondary | ICD-10-CM | POA: Diagnosis not present

## 2014-06-25 NOTE — Procedures (Signed)
     HISTORY:  Evelyn MantleMarsha Mccullough is a 34 year old patient with a history of onset of a right sided wristdrop that occurred 3.5 weeks prior to this evaluation. The patient reports numbness between the thumb and index finger. She indicates that she woke up with the deficit. She is being evaluated for a possible neuropathy or a cervical radiculopathy.  NERVE CONDUCTION STUDIES:  Nerve conduction studies were performed on both upper extremities. The distal motor latencies and motor amplitudes for the median, radial, and ulnar nerves were within normal limits. There was a significant decrease in motor amplitudes, however for the right radial nerve as compared to the left. The F wave latencies and nerve conduction velocities for the median and ulnar nerves were also normal. The sensory latencies for the median, radial, and ulnar nerves were normal.   EMG STUDIES:  EMG study was performed on the right upper extremity:  The first dorsal interosseous muscle reveals 2 to 4 K units with full recruitment. No fibrillations or positive waves were noted. The abductor pollicis brevis muscle reveals 2 to 4 K units with full recruitment. No fibrillations or positive waves were noted. The extensor indicis proprius muscle reveals 1 to 3 K units with decreased recruitment. 2+ fibrillations and positive waves were noted. The pronator teres muscle reveals 2 to 3 K units with full recruitment. No fibrillations or positive waves were noted. The brachioradialis muscle reveals 1 to 3 K units with decreased recruitment. 2+ fibrillations and positive waves were seen. 2+ fasciculations were seen. The biceps muscle reveals 1 to 2 K units with full recruitment. No fibrillations or positive waves were noted. The triceps muscle reveals 2 to 4 K units with full recruitment. No fibrillations or positive waves were noted. The anterior deltoid muscle reveals 2 to 3 K units with full recruitment. No fibrillations or positive waves  were noted. The cervical paraspinal muscles were tested at 2 levels. No abnormalities of insertional activity were seen at either level tested. There was fair relaxation.   IMPRESSION:  Nerve conduction studies done on the upper extremities shows no abnormalities, no evidence of a neuropathy is seen. There is a significant decrease in motor amplitudes for the right radial nerve, however, as compared to the left. 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 There is no evidence of an overlying cervical radiculopathy.  Marlan Palau. Keith Evelyn Kissler MD 06/25/2014 4:39 PM  [facility] [address] [place], [place] [id number]  Phone [phone] Fax [phone]

## 2014-06-25 NOTE — Progress Notes (Signed)
Please refer to EMG and nerve conduction study procedure note. 

## 2014-06-27 ENCOUNTER — Telehealth: Payer: Self-pay | Admitting: Neurology

## 2014-06-27 ENCOUNTER — Institutional Professional Consult (permissible substitution): Payer: Medicaid Other | Admitting: Neurology

## 2014-06-27 NOTE — Telephone Encounter (Signed)
This patient did not show for a new patient appointment today. The patient was aware of the appointment, she was here previously for EMG evaluation and we discussed the appointment date.

## 2014-06-28 ENCOUNTER — Encounter: Payer: Self-pay | Admitting: Neurology

## 2014-07-26 ENCOUNTER — Other Ambulatory Visit: Payer: Self-pay | Admitting: Family Medicine

## 2014-07-26 ENCOUNTER — Telehealth: Payer: Self-pay | Admitting: Family Medicine

## 2014-07-26 DIAGNOSIS — F418 Other specified anxiety disorders: Secondary | ICD-10-CM

## 2014-07-26 MED ORDER — CLONAZEPAM 0.5 MG PO TABS: 0.5000 mg | ORAL_TABLET | Freq: Two times a day (BID) | ORAL | Status: DC | PRN

## 2014-07-26 NOTE — Telephone Encounter (Signed)
Pt informed that Rx for Klonopin is up front for pick and to schedule an appt for July with new PCP to discuss depression/anxiety.  Pt stated understanding.  Clovis PuMartin, Dalin Caldera L, RN

## 2014-07-26 NOTE — Telephone Encounter (Signed)
Klonopin prescription with 1 refill placed at the front desk for patient to pick up. She will need to followup in July to meet her new PCP, and discuss her depression/anxiety in order to continue receive medications without any lapse in refills/meds. Please make pt aware. Thanks .

## 2014-07-26 NOTE — Telephone Encounter (Signed)
Pt called and needs a refill on her Klonopin left up front. Please call patient when ready. jw

## 2014-07-26 NOTE — Telephone Encounter (Signed)
Left patient a voicemail to call clinic back regarding Rx.  Clovis PuMartin, Clary Meeker L, RN

## 2014-08-31 ENCOUNTER — Other Ambulatory Visit: Payer: Self-pay | Admitting: *Deleted

## 2014-08-31 DIAGNOSIS — F418 Other specified anxiety disorders: Secondary | ICD-10-CM

## 2014-08-31 NOTE — Telephone Encounter (Signed)
Will forward to MD to see if he can give patient a script until her appt. Halen Mossbarger,CMA

## 2014-08-31 NOTE — Telephone Encounter (Signed)
Pt made appt for 09/13/14 (1st available) with new PCP.   She has refills on her paxil but is wondering if she can have a refill on her klonopin.  She "only takes them as needed" but is not sure she will have enough to last until appt. Lorik Guo, Maryjo RochesterJessica Dawn

## 2014-09-04 MED ORDER — CLONAZEPAM 0.5 MG PO TABS
0.5000 mg | ORAL_TABLET | Freq: Two times a day (BID) | ORAL | Status: DC | PRN
Start: 2014-09-04 — End: 2014-09-13

## 2014-09-04 NOTE — Telephone Encounter (Signed)
LM for patient to call back.  Please inform her that script is ready for pick up. Jazmin Hartsell,CMA  

## 2014-09-13 ENCOUNTER — Ambulatory Visit (INDEPENDENT_AMBULATORY_CARE_PROVIDER_SITE_OTHER): Payer: Medicaid Other | Admitting: Family Medicine

## 2014-09-13 ENCOUNTER — Encounter: Payer: Self-pay | Admitting: Family Medicine

## 2014-09-13 VITALS — BP 107/70 | HR 84 | Temp 98.7°F | Ht 63.0 in | Wt 98.2 lb

## 2014-09-13 DIAGNOSIS — Z309 Encounter for contraceptive management, unspecified: Secondary | ICD-10-CM | POA: Insufficient documentation

## 2014-09-13 DIAGNOSIS — F17200 Nicotine dependence, unspecified, uncomplicated: Secondary | ICD-10-CM

## 2014-09-13 DIAGNOSIS — G2581 Restless legs syndrome: Secondary | ICD-10-CM

## 2014-09-13 DIAGNOSIS — K219 Gastro-esophageal reflux disease without esophagitis: Secondary | ICD-10-CM | POA: Diagnosis not present

## 2014-09-13 DIAGNOSIS — F418 Other specified anxiety disorders: Secondary | ICD-10-CM | POA: Diagnosis not present

## 2014-09-13 DIAGNOSIS — Z3040 Encounter for surveillance of contraceptives, unspecified: Secondary | ICD-10-CM

## 2014-09-13 DIAGNOSIS — Z720 Tobacco use: Secondary | ICD-10-CM | POA: Diagnosis not present

## 2014-09-13 DIAGNOSIS — F1110 Opioid abuse, uncomplicated: Secondary | ICD-10-CM | POA: Diagnosis not present

## 2014-09-13 MED ORDER — PAROXETINE HCL 20 MG PO TABS: 20.0000 mg | ORAL_TABLET | Freq: Every day | ORAL | Status: DC

## 2014-09-13 MED ORDER — OMEPRAZOLE 20 MG PO CPDR: 20.0000 mg | DELAYED_RELEASE_CAPSULE | Freq: Every day | ORAL | Status: AC

## 2014-09-13 MED ORDER — CLONAZEPAM 0.5 MG PO TABS: 0.5000 mg | ORAL_TABLET | Freq: Two times a day (BID) | ORAL | Status: DC | PRN

## 2014-09-13 MED ORDER — ROPINIROLE HCL 0.25 MG PO TABS: 0.2500 mg | ORAL_TABLET | Freq: Every day | ORAL | Status: AC

## 2014-09-13 NOTE — Assessment & Plan Note (Signed)
Pt states mirena is overdue for removal. Recommended she schedule appt for removal and reinsertion if she wants to continue this. Also due for pap smear so could do both at the same time. F/u when patient schedule available.

## 2014-09-13 NOTE — Patient Instructions (Signed)
Ropinirole:  Start with 1 tablet of 0.25mg  at night. After 2 days increase to 0.5mg  (2 tablets). If you still are not getting relief after 7 days, increase to 4 tablets ( )

## 2014-09-13 NOTE — Assessment & Plan Note (Signed)
Taking klonopin for these symptoms. Will do trial of ropinirole to see if this helps with symptoms to alleviate need for benzo. Given instructions for uptitration. If fails, would also do trial of gabapentin. F/u 6 weeks.

## 2014-09-13 NOTE — Assessment & Plan Note (Signed)
Counseled regarding smoking cessation. Pt at this time interested in e-cig. Recommended she follow up in clinic or pharm clinic if she needs additional assistance regarding NRT. F/u as needed.

## 2014-09-13 NOTE — Assessment & Plan Note (Signed)
Stable. Now established with Dr. Jerre Simon. No additional f/u needed with our clinic.

## 2014-09-13 NOTE — Assessment & Plan Note (Signed)
Worsened off of PPI. Refill sent for prilosec. F/u as needed.

## 2014-09-13 NOTE — Progress Notes (Signed)
   Subjective:    Patient ID: Evelyn Mccullough, female    DOB: 1981/01/09, 34 y.o.   MRN: [id number]  HPI  CC: med refills  # Anxiety:  Last major panic episode was in 2007  Has been well controlled on paxil, also takes klonopin  Has been rx #30 klonopin, this lasts her about 3 weeks  Says she uses 1 klonopin at night, about 2x/week uses 1 tablet during the day, has had one night where her restless legs woke her up and she took a second klonopin. Had previously been on  tablets but was decreased to 0.5mg . ROS: no HA, no weakness  # Restless legs  Present for years, no worsening  Both legs  Says she takes the klonopin primarily for this  # Tobacco use  Wants to quit  Interested in researching for e-cigs  # Narcotic abuse  Recently established with Dr. Jerre Simon (neurologist also specializing in addiction medicine)  Feels this is going well  # Contraceptive management  Has mirena currently, but says it was supposed to come out last September (had been put in by Dr. Clelia Croft)  # Esophageal reflux  Has been out of prilosec  Feels symptoms have worsened off of medicine ROS: no abdominal pain, no diarrhea/constipation, no nausea/vomiting.  Review of Systems   See HPI for ROS.   Past medical history, surgical, family, and social history reviewed and updated in the EMR as appropriate. Objective:  BP 107/70 mmHg  Pulse 84  Temp(Src) 98.7 F (37.1 C) (Oral)  Ht  (1.6 m)  Wt 98 lb 3 oz (44.538 kg)  BMI 17.40 kg/m2 Vitals and nursing note reviewed  General: NAD, thin, appears older than stated age CV: RRR, nl s1s2 no m/r/g. 2+ radial pulses Resp: CTAB, normal effort Ext: no LE edema  Assessment & Plan:  Depression with anxiety Stable on paxil, klonopin. Refills paxil, klonopin #30 (last rx 7/12). F/u 6 weeks.  TOBACCO DEPENDENCE Counseled regarding smoking cessation. Pt at this time interested in e-cig. Recommended she follow up in clinic or pharm  clinic if she needs additional assistance regarding NRT. F/u as needed.  Narcotic abuse Stable. Now established with Dr. Jerre Simon. No additional f/u needed with our clinic.  Contraceptive management Pt states mirena is overdue for removal. Recommended she schedule appt for removal and reinsertion if she wants to continue this. Also due for pap smear so could do both at the same time. F/u when patient schedule available.  Restless legs syndrome Taking klonopin for these symptoms. Will do trial of ropinirole to see if this helps with symptoms to alleviate need for benzo. Given instructions for uptitration. If fails, would also do trial of gabapentin. F/u 6 weeks.  ESOPHAGEAL REFLUX Worsened off of PPI. Refill sent for prilosec. F/u as needed.

## 2014-09-13 NOTE — Assessment & Plan Note (Signed)
Stable on paxil, klonopin. Refills paxil, klonopin #30 (last rx 7/12). F/u 6 weeks.

## 2014-10-09 ENCOUNTER — Other Ambulatory Visit: Payer: Self-pay | Admitting: *Deleted

## 2014-10-09 DIAGNOSIS — F418 Other specified anxiety disorders: Secondary | ICD-10-CM

## 2014-10-10 NOTE — Telephone Encounter (Signed)
2nd request.  Perry Molla L, RN  

## 2014-10-12 MED ORDER — CLONAZEPAM 0.5 MG PO TABS
0.5000 mg | ORAL_TABLET | Freq: Two times a day (BID) | ORAL | Status: DC | PRN
Start: 2014-10-12 — End: 2014-11-13

## 2014-11-13 ENCOUNTER — Other Ambulatory Visit: Payer: Self-pay | Admitting: *Deleted

## 2014-11-13 DIAGNOSIS — F418 Other specified anxiety disorders: Secondary | ICD-10-CM

## 2014-11-14 NOTE — Telephone Encounter (Signed)
2nd request.  Martin, Tamika L, RN  

## 2014-11-15 MED ORDER — CLONAZEPAM 0.5 MG PO TABS
0.5000 mg | ORAL_TABLET | Freq: Two times a day (BID) | ORAL | Status: DC | PRN
Start: 2014-11-15 — End: 2014-12-13

## 2014-11-15 NOTE — Telephone Encounter (Signed)
Advised pt Rx ready for PU at front. Adams,Latoya, CMA.

## 2014-11-15 NOTE — Telephone Encounter (Signed)
Please call. Rx for klonopin is at front desk. She needs apt with PCP prior to additional refills.

## 2014-11-26 ENCOUNTER — Other Ambulatory Visit: Payer: Self-pay | Admitting: Family Medicine

## 2014-11-26 DIAGNOSIS — F418 Other specified anxiety disorders: Secondary | ICD-10-CM

## 2014-11-26 MED ORDER — PAROXETINE HCL 20 MG PO TABS: 20.0000 mg | ORAL_TABLET | Freq: Every day | ORAL | Status: DC

## 2014-11-26 NOTE — Telephone Encounter (Signed)
Need refill on her Paxil.  Only have one tablet to take today.

## 2014-12-13 ENCOUNTER — Other Ambulatory Visit: Payer: Self-pay | Admitting: Family Medicine

## 2014-12-13 DIAGNOSIS — F418 Other specified anxiety disorders: Secondary | ICD-10-CM

## 2014-12-13 NOTE — Telephone Encounter (Signed)
Pt called and needs a refill on her Klonopin called in. jw °

## 2014-12-17 MED ORDER — CLONAZEPAM 0.5 MG PO TABS
0.5000 mg | ORAL_TABLET | Freq: Two times a day (BID) | ORAL | Status: DC | PRN
Start: 2014-12-17 — End: 2015-01-29

## 2014-12-17 NOTE — Telephone Encounter (Signed)
Spoke with patient and informed her printed prescription will be left up front for her to pick up. Patient appreciative and stated she would be calling to schedule an appointment.

## 2015-01-29 ENCOUNTER — Other Ambulatory Visit: Payer: Self-pay | Admitting: Family Medicine

## 2015-01-29 NOTE — Telephone Encounter (Signed)
Patient is aware of this and will plan to pick up tomorrow. Oberia Beaudoin,CMA

## 2015-02-25 ENCOUNTER — Other Ambulatory Visit: Payer: Self-pay | Admitting: Family Medicine

## 2015-02-26 NOTE — Telephone Encounter (Signed)
Patient is aware of this. Jazmin Hartsell,CMA  

## 2015-03-07 ENCOUNTER — Telehealth: Payer: Self-pay | Admitting: Family Medicine

## 2015-03-07 NOTE — Telephone Encounter (Signed)
Pt called because she has a prescription up front for Klonopin and would like to know if we can mail it to her. The address in Epic is correct. jw

## 2015-03-07 NOTE — Telephone Encounter (Signed)
Will confirm with MD that this is ok. Betheny Suchecki,CMA

## 2015-03-11 NOTE — Telephone Encounter (Signed)
Script pulled and mailed to patient. J.mh

## 2015-03-11 NOTE — Telephone Encounter (Signed)
That is fine. -Dr. Waynetta SandyWight

## 2015-04-18 ENCOUNTER — Other Ambulatory Visit: Payer: Self-pay | Admitting: Family Medicine

## 2015-04-22 NOTE — Telephone Encounter (Signed)
LM for patient that script is ready for pick up. Tresa Jolley,CMA  

## 2015-05-30 ENCOUNTER — Other Ambulatory Visit: Payer: Self-pay | Admitting: Family Medicine

## 2015-07-31 ENCOUNTER — Other Ambulatory Visit: Payer: Self-pay | Admitting: Family Medicine

## 2015-08-05 NOTE — Telephone Encounter (Signed)
2nd request.  Meshulem Onorato L, RN  

## 2015-08-12 NOTE — Telephone Encounter (Signed)
Phoned in rx. 

## 2020-08-24 ENCOUNTER — Encounter (HOSPITAL_COMMUNITY): Payer: Self-pay

## 2020-08-24 ENCOUNTER — Emergency Department (HOSPITAL_COMMUNITY)
Admission: EM | Admit: 2020-08-24 | Discharge: 2020-08-25 | Disposition: A | Discharge status: Home / Self Care | Payer: Self-pay | Attending: Emergency Medicine | Admitting: Emergency Medicine

## 2020-08-24 ENCOUNTER — Other Ambulatory Visit: Payer: Self-pay

## 2020-08-24 DIAGNOSIS — U071 COVID-19: Secondary | ICD-10-CM

## 2020-08-24 DIAGNOSIS — F17210 Nicotine dependence, cigarettes, uncomplicated: Secondary | ICD-10-CM | POA: Insufficient documentation

## 2020-08-24 DIAGNOSIS — Z28310 Unvaccinated for covid-19: Secondary | ICD-10-CM | POA: Insufficient documentation

## 2020-08-24 LAB — BASIC METABOLIC PANEL
Anion gap: 16 — ABNORMAL HIGH (ref 5–15)
BUN: 21 mg/dL — ABNORMAL HIGH (ref 6–20)
CO2: 22 mmol/L (ref 22–32)
Calcium: 9 mg/dL (ref 8.9–10.3)
Chloride: 98 mmol/L (ref 98–111)
Creatinine, Ser: 1.06 mg/dL — ABNORMAL HIGH (ref 0.44–1.00)
GFR, Estimated: >60 mL/min (ref >60)
Glucose, Bld: 97 mg/dL (ref 70–99)
Potassium: 3.2 mmol/L — ABNORMAL LOW (ref 3.5–5.1)
Sodium: 136 mmol/L (ref 135–145)

## 2020-08-24 LAB — CBC WITH DIFFERENTIAL/PLATELET
Abs Immature Granulocytes: 0 10*3/uL (ref 0.00–0.07)
Basophils Absolute: 0 10*3/uL (ref 0.0–0.1)
Basophils Relative: 1 %
Eosinophils Absolute: 0 10*3/uL (ref 0.0–0.5)
Eosinophils Relative: 0 %
HCT: 48.9 % — ABNORMAL HIGH (ref 36.0–46.0)
Hemoglobin: 16.4 g/dL — ABNORMAL HIGH (ref 12.0–15.0)
Immature Granulocytes: 0 %
Lymphocytes Relative: 17 %
Lymphs Abs: 0.4 10*3/uL — ABNORMAL LOW (ref 0.7–4.0)
MCH: 29.6 pg (ref 26.0–34.0)
MCHC: 33.5 g/dL (ref 30.0–36.0)
MCV: 88.3 fL (ref 80.0–100.0)
Monocytes Absolute: 0.3 10*3/uL (ref 0.1–1.0)
Monocytes Relative: 13 %
Neutro Abs: 1.5 10*3/uL — ABNORMAL LOW (ref 1.7–7.7)
Neutrophils Relative %: 69 %
Platelets: 97 10*3/uL — ABNORMAL LOW (ref 150–400)
RBC: 5.54 MIL/uL — ABNORMAL HIGH (ref 3.87–5.11)
RDW: 12.7 % (ref 11.5–15.5)
WBC: 2.2 10*3/uL — ABNORMAL LOW (ref 4.0–10.5)
nRBC: 0 % (ref 0.0–0.2)

## 2020-08-24 MED ORDER — ONDANSETRON HCL 4 MG/2ML IJ SOLN
4.0000 mg | Freq: Once | INTRAMUSCULAR | Status: AC
  Administered 2020-08-24: 4 mg via INTRAVENOUS
  Filled 2020-08-24: qty 2

## 2020-08-24 MED ORDER — SODIUM CHLORIDE 0.9 % IV BOLUS
1000.0000 mL | Freq: Once | INTRAVENOUS | Status: AC
  Administered 2020-08-24: 1000 mL via INTRAVENOUS

## 2020-08-24 MED ORDER — ONDANSETRON 8 MG PO TBDP: 8.0000 mg | ORAL_TABLET | Freq: Once | ORAL | Status: DC

## 2020-08-24 NOTE — ED Provider Notes (Signed)
[facility] EMERGENCY DEPARTMENT Provider Note   CSN: [id number] Arrival date & time: 08/24/20  2205     History Chief Complaint  Patient presents with   Emesis    Covid + home test    Evelyn Mccullough is a 40 y.o. female.  Patient presents chief complaint of generalized body aches and vomiting ongoing for 3 days.  She tested herself at home and found be COVID-positive today and presents to the ER.  Today is day 3 of her symptoms.  She denies cough denies diarrhea.  States that she feels dehydrated presents to the ER for assistance.  She states that she is not previously vaccinated.      Past Medical History:  Diagnosis Date   Anxiety    Depression    GERD (gastroesophageal reflux disease)    Restless leg syndrome    Substance abuse [facility])     Patient Active Problem List   Diagnosis Date Noted   Contraceptive management 09/13/2014   Restless legs syndrome 09/13/2014   Acute radial nerve palsy of right upper extremity 06/07/2014   Narcotic abuse (HCC) 01/05/2014   Depression with anxiety 12/20/2008   PANIC ATTACK 02/11/2007   ESOPHAGEAL REFLUX 02/11/2007   TOBACCO DEPENDENCE 04/22/2006    History reviewed. No pertinent surgical history.   OB History     Gravida  3   Para  3   Term  3   Preterm      AB      Living  3      SAB      IAB      Ectopic      Multiple      Live Births              Family History  Problem Relation Age of Onset   Hypertension Father    Hyperlipidemia Father    Stroke Father    Stroke Paternal Grandmother    Breast cancer Paternal Grandmother     Social History   Tobacco Use   Smoking status: Every Day    Packs/day: 0.50    Years: 10.00    Pack years: 5.00    Types: Cigarettes   Smokeless tobacco: Never   Tobacco comments:    wants to quitt with husband  Vaping Use   Vaping Use: Never used  Substance Use Topics   Alcohol use: No   Drug use: No    Home Medications Prior to Admission  medications   Medication Sig Start Date End Date Taking? Authorizing Provider  clonazePAM (KLONOPIN) 0.5 MG tablet TAKE 1 TABLET TWICE A DAY AS NEEDED ANXIETY 08/12/15   Nani Ravens, MD  omeprazole (PRILOSEC) 20 MG capsule Take 1 capsule (20 mg total) by mouth daily. 09/13/14   Nani Ravens, MD  PARoxetine (PAXIL) 20 MG tablet TAKE 1 TABLET (20 MG TOTAL) BY MOUTH DAILY. 05/31/15   Nani Ravens, MD  promethazine (PHENERGAN) 25 MG tablet Take 1 tablet (25 mg total) by mouth every 6 (six) hours as needed for nausea. 01/12/14   Geoffery Lyons, MD  rOPINIRole (REQUIP) 0.25 MG tablet Take 1 tablet (0.25 mg total) by mouth at bedtime. Increase to 2 tablets at bedtime in 2 days 09/13/14   Nani Ravens, MD    Allergies    Patient has no known allergies.  Review of Systems   Review of Systems  Constitutional:  Positive for fatigue. Negative for fever.  HENT:  Negative for ear  pain.   Eyes:  Negative for pain.  Respiratory:  Negative for cough.   Cardiovascular:  Negative for chest pain.  Gastrointestinal:  Negative for abdominal pain.  Genitourinary:  Negative for flank pain.  Musculoskeletal:  Negative for back pain.  Skin:  Negative for rash.  Neurological:  Negative for headaches.   Physical Exam Updated Vital Signs BP 120/75   Pulse 69   Temp (!) 97.5 F (36.4 C) (Oral)   Resp 20   Ht 5\' 3"  (1.6 m)   Wt 45.4 kg   SpO2 97%   BMI 17.71 kg/m   Physical Exam Constitutional:      General: She is not in acute distress.    Appearance: Normal appearance.  HENT:     Head: Normocephalic.     Nose: Nose normal.  Eyes:     Extraocular Movements: Extraocular movements intact.  Cardiovascular:     Rate and Rhythm: Normal rate.  Pulmonary:     Effort: Pulmonary effort is normal.  Musculoskeletal:        General: Normal range of motion.     Cervical back: Normal range of motion.  Neurological:     General: No focal deficit present.     Mental Status: She is alert. Mental  status is at baseline.    ED Results / Procedures / Treatments   Labs (all labs ordered are listed, but only abnormal results are displayed) Labs Reviewed  RESP PANEL BY RT-PCR (FLU A&B, COVID) ARPGX2  CBC WITH DIFFERENTIAL/PLATELET  BASIC METABOLIC PANEL    EKG None  Radiology No results found.  Procedures Procedures   Medications Ordered in ED Medications  sodium chloride 0.9 % bolus 1,000 mL (1,000 mLs Intravenous New Bag/Given 08/24/20 2304)  ondansetron (ZOFRAN) injection 4 mg (4 mg Intravenous Given 08/24/20 2304)    ED Course  I have reviewed the triage vital signs and the nursing notes.  Pertinent labs & imaging results that were available during my care of the patient were reviewed by me and considered in my medical decision making (see chart for details).    MDM Rules/Calculators/A&P                          Vitals within normal limits.  Exam is benign patient appears fatigued.  Normal exam.  Findings consistent with COVID infection O2 sats 100% room air.  Patient requesting IV fluids which has been ordered.  Patient amenable to oral COVID antiviral medications which will be ordered.   Final Clinical Impression(s) / ED Diagnoses Final diagnoses:  None    Rx / DC Orders ED Discharge Orders     None        2305, MD 08/24/20 2308

## 2020-08-24 NOTE — ED Triage Notes (Signed)
Pt arrived via POV from home. Pt gave a self-Covid Test at home and was positive. Pt has N/V/D, weakness during Triage.

## 2020-08-25 ENCOUNTER — Encounter (HOSPITAL_COMMUNITY): Payer: Self-pay

## 2020-08-25 LAB — RESP PANEL BY RT-PCR (FLU A&B, COVID) ARPGX2
Influenza A by PCR: NEGATIVE
Influenza B by PCR: NEGATIVE
SARS Coronavirus 2 by RT PCR: POSITIVE — AB

## 2020-08-25 MED ORDER — NIRMATRELVIR/RITONAVIR (PAXLOVID)TABLET: 3.0000 | ORAL_TABLET | Freq: Two times a day (BID) | ORAL | 0 refills | Status: DC

## 2020-08-25 MED ORDER — NIRMATRELVIR/RITONAVIR (PAXLOVID)TABLET: 3.0000 | ORAL_TABLET | Freq: Two times a day (BID) | ORAL | 0 refills | Status: AC

## 2020-08-25 MED ORDER — PROMETHAZINE HCL 25 MG RE SUPP
25.0000 mg | Freq: Four times a day (QID) | RECTAL | Status: DC | PRN
  Administered 2020-08-25: 25 mg via RECTAL
  Filled 2020-08-25: qty 1

## 2020-08-25 MED ORDER — PROMETHAZINE HCL 25 MG PO TABS: 25.0000 mg | ORAL_TABLET | Freq: Four times a day (QID) | ORAL | 0 refills | Status: AC | PRN

## 2020-08-25 NOTE — ED Notes (Signed)
CRITICAL VALUE STICKER CRITICAL VALUE: Covid + Reported to Dr. Hong.   

## 2020-08-25 NOTE — Discharge Instructions (Signed)
Take the antiviral medication prescription for COVID starting tomorrow.  If you had any testing done today, the results will show up on your MyChart phone app in 24 hours.  Call your primary care doctor in the next 1-2 days to arrange video follow-up.   Use a finger pulse oximeter at home.  You may purchase one at [facility] or [facility] or online.  If the numbers drops and stays below 90%, return immediately back to the ER.  Otherwise increase your fluid intake, isolate at home for 5 days after symptoms resolve, and inform recent close contacts of the need to test for Covid.

## 2020-08-25 NOTE — ED Notes (Signed)
Pt given ice water for po challenge

## 2020-08-25 NOTE — ED Notes (Signed)
Pt vomited large amount of emesis.
